# Patient Record
Sex: Female | Born: 1955 | Race: White | Hispanic: No | Marital: Married | Smoking: Never smoker
Health system: Southern US, Community
[De-identification: ages and names within clinical notes are randomized; demographics above are authoritative.]

## PROBLEM LIST (undated history)

## (undated) DIAGNOSIS — G459 Transient cerebral ischemic attack, unspecified: Secondary | ICD-10-CM

## (undated) DIAGNOSIS — I4891 Unspecified atrial fibrillation: Secondary | ICD-10-CM

## (undated) DIAGNOSIS — M1990 Unspecified osteoarthritis, unspecified site: Secondary | ICD-10-CM

## (undated) DIAGNOSIS — Z8619 Personal history of other infectious and parasitic diseases: Secondary | ICD-10-CM

## (undated) DIAGNOSIS — C801 Malignant (primary) neoplasm, unspecified: Secondary | ICD-10-CM

## (undated) DIAGNOSIS — Z974 Presence of external hearing-aid: Secondary | ICD-10-CM

## (undated) HISTORY — DX: Personal history of other infectious and parasitic diseases: Z86.19

## (undated) HISTORY — PX: VAGINAL HYSTERECTOMY: SUR661

---

## 2007-02-18 ENCOUNTER — Ambulatory Visit: Payer: Self-pay | Admitting: Family Medicine

## 2007-02-21 ENCOUNTER — Ambulatory Visit: Payer: Self-pay | Admitting: Otolaryngology

## 2007-03-11 ENCOUNTER — Ambulatory Visit: Payer: Self-pay | Admitting: Gastroenterology

## 2007-05-13 ENCOUNTER — Ambulatory Visit: Payer: Self-pay | Admitting: Family Medicine

## 2007-09-19 ENCOUNTER — Ambulatory Visit: Payer: Self-pay | Admitting: Family Medicine

## 2008-05-13 ENCOUNTER — Ambulatory Visit: Payer: Self-pay | Admitting: Family Medicine

## 2008-08-09 ENCOUNTER — Ambulatory Visit: Payer: Self-pay | Admitting: Family Medicine

## 2009-06-24 ENCOUNTER — Ambulatory Visit: Payer: Self-pay | Admitting: Family Medicine

## 2010-08-31 ENCOUNTER — Ambulatory Visit: Payer: Self-pay | Admitting: Family Medicine

## 2011-03-29 LAB — HM COLONOSCOPY

## 2011-07-15 ENCOUNTER — Ambulatory Visit: Payer: Self-pay | Admitting: Internal Medicine

## 2011-08-04 ENCOUNTER — Ambulatory Visit: Payer: Self-pay | Admitting: Urology

## 2011-10-18 ENCOUNTER — Ambulatory Visit: Payer: Self-pay | Admitting: Family Medicine

## 2012-03-03 ENCOUNTER — Ambulatory Visit: Payer: Self-pay | Admitting: Physician Assistant

## 2012-08-14 HISTORY — PX: COLONOSCOPY: SHX174

## 2012-10-23 LAB — HM PAP SMEAR: HM Pap smear: NORMAL

## 2012-10-24 LAB — LIPID PANEL
CHOLESTEROL: 204 mg/dL — AB (ref 0–200)
HDL: 53 mg/dL (ref 35–70)
LDL Cholesterol: 137 mg/dL
TRIGLYCERIDES: 72 mg/dL (ref 40–160)

## 2012-10-24 LAB — FECAL OCCULT BLOOD, GUAIAC: Fecal Occult Blood: NEGATIVE

## 2012-10-31 ENCOUNTER — Ambulatory Visit: Payer: Self-pay | Admitting: Family Medicine

## 2012-11-05 ENCOUNTER — Ambulatory Visit: Payer: Self-pay | Admitting: Family Medicine

## 2012-11-05 LAB — HM DEXA SCAN

## 2012-11-18 ENCOUNTER — Ambulatory Visit: Payer: Self-pay | Admitting: Family Medicine

## 2012-11-18 LAB — HM MAMMOGRAPHY

## 2012-12-03 ENCOUNTER — Ambulatory Visit: Payer: Self-pay | Admitting: Surgery

## 2012-12-03 HISTORY — PX: BREAST BIOPSY: SHX20

## 2012-12-05 LAB — PATHOLOGY REPORT

## 2014-07-10 ENCOUNTER — Ambulatory Visit: Payer: Self-pay | Admitting: Physician Assistant

## 2014-07-10 LAB — URINALYSIS, COMPLETE
Glucose,UR: NEGATIVE
KETONE: NEGATIVE
Nitrite: NEGATIVE
PH: 6 (ref 5.0–8.0)
Protein: >=300
Specific Gravity: 1.02 (ref 1.000–1.030)

## 2014-07-12 LAB — URINE CULTURE

## 2015-02-17 ENCOUNTER — Encounter: Payer: Self-pay | Admitting: Family Medicine

## 2015-02-17 ENCOUNTER — Telehealth: Payer: Self-pay | Admitting: Surgery

## 2015-02-17 ENCOUNTER — Ambulatory Visit (INDEPENDENT_AMBULATORY_CARE_PROVIDER_SITE_OTHER): Payer: BC Managed Care – PPO | Admitting: Family Medicine

## 2015-02-17 ENCOUNTER — Other Ambulatory Visit: Payer: Self-pay | Admitting: Family Medicine

## 2015-02-17 VITALS — BP 100/62 | HR 72 | Ht 67.0 in | Wt 121.0 lb

## 2015-02-17 DIAGNOSIS — Z8619 Personal history of other infectious and parasitic diseases: Secondary | ICD-10-CM

## 2015-02-17 DIAGNOSIS — M810 Age-related osteoporosis without current pathological fracture: Secondary | ICD-10-CM

## 2015-02-17 DIAGNOSIS — Z0000 Encounter for general adult medical examination without abnormal findings: Secondary | ICD-10-CM | POA: Diagnosis not present

## 2015-02-17 LAB — HEMOCCULT GUIAC POC 1CARD (OFFICE): FECAL OCCULT BLD: NEGATIVE

## 2015-02-17 LAB — POCT URINALYSIS DIPSTICK
BILIRUBIN UA: NEGATIVE
Glucose, UA: NEGATIVE
Ketones, UA: NEGATIVE
Leukocytes, UA: NEGATIVE
Nitrite, UA: NEGATIVE
Protein, UA: NEGATIVE
RBC UA: NEGATIVE
SPEC GRAV UA: 1.01
Urobilinogen, UA: 0.2
pH, UA: 5

## 2015-02-17 MED ORDER — VALACYCLOVIR HCL 500 MG PO TABS: 500.0000 mg | ORAL_TABLET | Freq: Two times a day (BID) | ORAL | Status: DC

## 2015-02-17 NOTE — Telephone Encounter (Signed)
Dr. Ronnald Ramp needs clearance letter per pt.

## 2015-02-17 NOTE — Addendum Note (Signed)
Addended by: Fredderick Severance on: 02/17/2015 12:05 PM   Modules accepted: Orders

## 2015-02-17 NOTE — Telephone Encounter (Signed)
Spoke with patient to better understand what it is that she needs. She states that Dr. Ronnald Ramp was requesting the results of her breast biopsy in 2014 as well as a letter stating that she may return to yearly mammograms. Last office note from 12/13/12 and pathology results faxed over at this time.

## 2015-02-17 NOTE — Progress Notes (Signed)
Name: Margaret Kennedy   MRN: [id number]    DOB: November 06, 1955   Date:02/17/2015       Progress Note  Subjective  Chief Complaint  Chief Complaint  Patient presents with  . Annual Exam    pap- no issues    HPI Comments: Patient with no medical issues or physical complaints.     No problem-specific assessment & plan notes found for this encounter.   Past Medical History  Diagnosis Date  . History of cold sores     Past Surgical History  Procedure Laterality Date  . Vaginal hysterectomy    . Colonoscopy  2014    cleared for 5 years    Family History  Problem Relation Age of Onset  . Cancer Mother   . Cancer Father   . Cancer Daughter     History   Social History  . Marital Status: Married    Spouse Name: N/A  . Number of Children: N/A  . Years of Education: N/A   Occupational History  . Not on file.   Social History Main Topics  . Smoking status: Never Smoker   . Smokeless tobacco: Not on file  . Alcohol Use: 0.0 oz/week    0 Standard drinks or equivalent per week  . Drug Use: No  . Sexual Activity: Yes   Other Topics Concern  . Not on file   Social History Narrative  . No narrative on file    No Known Allergies   Review of Systems  Constitutional: Negative for fever, chills, weight loss and malaise/fatigue.  HENT: Negative for ear discharge, ear pain and sore throat.   Eyes: Negative for blurred vision.  Respiratory: Negative for cough, sputum production, shortness of breath and wheezing.   Cardiovascular: Negative for chest pain, palpitations and leg swelling.  Gastrointestinal: Negative for heartburn, nausea, abdominal pain, diarrhea, constipation, blood in stool and melena.  Genitourinary: Positive for frequency. Negative for dysuria, urgency and hematuria.  Musculoskeletal: Negative for myalgias, back pain, joint pain and neck pain.  Skin: Negative for rash.  Neurological: Negative for dizziness, tingling, sensory change, focal weakness and  headaches.  Endo/Heme/Allergies: Negative for environmental allergies and polydipsia. Does not bruise/bleed easily.  Psychiatric/Behavioral: Negative for depression and suicidal ideas. The patient is not nervous/anxious and does not have insomnia.      Objective  Filed Vitals:   02/17/15 0831  BP: 100/62  Pulse: 72  Height: 5\' 7"  (1.702 m)  Weight: 121 lb (54.885 kg)    Physical Exam  Constitutional: She is well-developed, well-nourished, and in no distress. No distress.  HENT:  Head: Normocephalic and atraumatic.  Right Ear: External ear normal.  Left Ear: External ear normal.  Nose: Nose normal.  Mouth/Throat: Oropharynx is clear and moist.  Eyes: Conjunctivae and EOM are normal. Pupils are equal, round, and reactive to light. Right eye exhibits no discharge. Left eye exhibits no discharge.  Neck: Normal range of motion. Neck supple. No JVD present. No tracheal deviation present. No thyromegaly present.  Cardiovascular: Normal rate, regular rhythm, normal heart sounds and intact distal pulses.  Exam reveals no gallop and no friction rub.   No murmur heard. Pulmonary/Chest: Effort normal and breath sounds normal. No respiratory distress. She has no wheezes. She has no rales. She exhibits no tenderness.  Abdominal: Soft. Bowel sounds are normal. She exhibits no mass. There is no tenderness. There is no guarding.  Genitourinary: Vagina normal, right adnexa normal and left adnexa normal. Guaiac negative stool.  No vaginal discharge found.  Musculoskeletal: Normal range of motion. She exhibits no edema.  Lymphadenopathy:    She has no cervical adenopathy.  Neurological: She is alert. She has normal reflexes.  Skin: Skin is warm and dry. She is not diaphoretic.  Psychiatric: Mood and affect normal.  Nursing note and vitals reviewed.     Assessment & Plan  Problem List Items Addressed This Visit    None    Visit Diagnoses    Annual physical exam    -  Primary    Relevant  Orders    Lipid Profile    Renal Function Panel    Cytology - PAP    POCT Urinalysis Dipstick    POCT Occult Blood Stool    Osteoporosis        Relevant Orders    DG Bone Density    Hx of cold sores        Relevant Medications    valACYclovir (VALTREX) 500 MG tablet         Dr. Deanna Jones [facility]  02/17/2015

## 2015-02-18 LAB — RENAL FUNCTION PANEL
Albumin: 4.6 g/dL (ref 3.5–5.5)
BUN/Creatinine Ratio: 23 (ref 9–23)
BUN: 15 mg/dL (ref 6–24)
CO2: 24 mmol/L (ref 18–29)
Calcium: 9.4 mg/dL (ref 8.7–10.2)
Chloride: 100 mmol/L (ref 97–108)
Creatinine, Ser: 0.64 mg/dL (ref 0.57–1.00)
GFR calc Af Amer: 114 mL/min/{1.73_m2} (ref >59)
GFR calc non Af Amer: 99 mL/min/{1.73_m2} (ref >59)
GLUCOSE: 76 mg/dL (ref 65–99)
Phosphorus: 3.7 mg/dL (ref 2.5–4.5)
Potassium: 4.3 mmol/L (ref 3.5–5.2)
SODIUM: 140 mmol/L (ref 134–144)

## 2015-02-18 LAB — LIPID PANEL
Chol/HDL Ratio: 3.5 ratio units (ref 0.0–4.4)
Cholesterol, Total: 229 mg/dL — ABNORMAL HIGH (ref 100–199)
HDL: 65 mg/dL (ref >39)
LDL Calculated: 149 mg/dL — ABNORMAL HIGH (ref 0–99)
Triglycerides: 77 mg/dL (ref 0–149)
VLDL CHOLESTEROL CAL: 15 mg/dL (ref 5–40)

## 2015-02-18 NOTE — Addendum Note (Signed)
Addended by: Fredderick Severance on: 02/18/2015 04:17 PM   Modules accepted: Orders

## 2015-02-22 LAB — PAP LB (LIQUID-BASED): PAP SMEAR COMMENT: 0

## 2015-02-23 ENCOUNTER — Ambulatory Visit
Admission: RE | Admit: 2015-02-23 | Discharge: 2015-02-23 | Disposition: A | Discharge status: Home / Self Care | Payer: BC Managed Care – PPO | Source: Ambulatory Visit | Attending: Family Medicine | Admitting: Family Medicine

## 2015-02-23 DIAGNOSIS — M810 Age-related osteoporosis without current pathological fracture: Secondary | ICD-10-CM | POA: Diagnosis present

## 2015-02-23 DIAGNOSIS — Z1231 Encounter for screening mammogram for malignant neoplasm of breast: Secondary | ICD-10-CM | POA: Insufficient documentation

## 2015-02-23 DIAGNOSIS — Z0000 Encounter for general adult medical examination without abnormal findings: Secondary | ICD-10-CM

## 2015-02-23 HISTORY — DX: Malignant (primary) neoplasm, unspecified: C80.1

## 2015-05-04 NOTE — Telephone Encounter (Signed)
error 

## 2015-06-10 ENCOUNTER — Ambulatory Visit (INDEPENDENT_AMBULATORY_CARE_PROVIDER_SITE_OTHER): Payer: BC Managed Care – PPO | Admitting: Family Medicine

## 2015-06-10 ENCOUNTER — Encounter: Payer: Self-pay | Admitting: Family Medicine

## 2015-06-10 VITALS — BP 110/78 | HR 70 | Ht 67.0 in | Wt 125.0 lb

## 2015-06-10 DIAGNOSIS — N3090 Cystitis, unspecified without hematuria: Secondary | ICD-10-CM | POA: Diagnosis not present

## 2015-06-10 DIAGNOSIS — B373 Candidiasis of vulva and vagina: Secondary | ICD-10-CM

## 2015-06-10 DIAGNOSIS — B3731 Acute candidiasis of vulva and vagina: Secondary | ICD-10-CM

## 2015-06-10 LAB — POCT URINALYSIS DIPSTICK
Bilirubin, UA: NEGATIVE
Glucose, UA: NEGATIVE
KETONES UA: NEGATIVE
Nitrite, UA: NEGATIVE
PROTEIN UA: NEGATIVE
Spec Grav, UA: 1.01
Urobilinogen, UA: 0.2
pH, UA: 5

## 2015-06-10 MED ORDER — SULFAMETHOXAZOLE-TRIMETHOPRIM 800-160 MG PO TABS: 1.0000 | ORAL_TABLET | Freq: Two times a day (BID) | ORAL | Status: DC

## 2015-06-10 MED ORDER — FLUCONAZOLE 150 MG PO TABS: 150.0000 mg | ORAL_TABLET | Freq: Once | ORAL | Status: DC

## 2015-06-10 NOTE — Progress Notes (Signed)
Name: Margaret Kennedy   MRN: [id number]    DOB: [birth year]   Date:06/10/2015       Progress Note  Subjective  Chief Complaint  Chief Complaint  Patient presents with  . Urinary Tract Infection    Urinary Tract Infection  This is a new problem. The current episode started 1 to 4 weeks ago. The problem occurs every urination. The problem has been waxing and waning. The quality of the pain is described as burning. The pain is at a severity of 2/10. The pain is mild. There has been no fever. Pertinent negatives include no chills, discharge, flank pain, frequency, hematuria, hesitancy, nausea or urgency. 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 The treatment provided no relief.    No problem-specific assessment & plan notes found for this encounter.   Past Medical History  Diagnosis Date  . History of cold sores   . Cancer [facility])     skin    Past Surgical History  Procedure Laterality Date  . Vaginal hysterectomy    . Colonoscopy  2014    cleared for 5 years  . Breast biopsy Left 12/03/12    u/s bx/clip-neg    Family History  Problem Relation Age of Onset  . Cancer Mother   . Cancer Father   . Cancer Daughter   . Breast cancer Daughter 58    Social History   Social History  . Marital Status: Married    Spouse Name: N/A  . Number of Children: N/A  . Years of Education: N/A   Occupational History  . Not on file.   Social History Main Topics  . Smoking status: Never Smoker   . Smokeless tobacco: Not on file  . Alcohol Use: 0.0 oz/week    0 Standard drinks or equivalent per week  . Drug Use: No  . Sexual Activity: Yes   Other Topics Concern  . Not on file   Social History Narrative    No Known Allergies   Review of Systems  Constitutional: Negative for fever, chills, weight loss and malaise/fatigue.  HENT: Negative for ear discharge, ear pain and sore throat.   Eyes: Negative for blurred vision.  Respiratory: Negative for cough, sputum  production, shortness of breath and wheezing.   Cardiovascular: Negative for chest pain, palpitations and leg swelling.  Gastrointestinal: Negative for heartburn, nausea, abdominal pain, diarrhea, constipation, blood in stool and melena.  Genitourinary: Negative for dysuria, hesitancy, urgency, frequency, hematuria and flank pain.  Musculoskeletal: Negative for myalgias, back pain, joint pain and neck pain.  Skin: Negative for rash.  Neurological: Negative for dizziness, tingling, sensory change, focal weakness and headaches.  Endo/Heme/Allergies: Negative for environmental allergies and polydipsia. Does not bruise/bleed easily.  Psychiatric/Behavioral: Negative for depression and suicidal ideas. The patient is not nervous/anxious and does not have insomnia.      Objective  Filed Vitals:   06/10/15 1537  BP: 110/78  Pulse: 70  Height: 5\' 7"  (1.702 m)  Weight: 125 lb (56.7 kg)    Physical Exam  Constitutional: She is well-developed, well-nourished, and in no distress. No distress.  HENT:  Head: Normocephalic and atraumatic.  Right Ear: External ear normal.  Left Ear: External ear normal.  Nose: Nose normal.  Mouth/Throat: Oropharynx is clear and moist.  Eyes: Conjunctivae and EOM are normal. Pupils are equal, round, and reactive to light. Right eye exhibits no discharge. Left eye exhibits no discharge.  Neck: Normal range of motion. Neck supple. No JVD present.  No thyromegaly present.  Cardiovascular: Normal rate, regular rhythm, normal heart sounds and intact distal pulses.  Exam reveals no gallop and no friction rub.   No murmur heard. Pulmonary/Chest: Effort normal and breath sounds normal.  Abdominal: Soft. Bowel sounds are normal. She exhibits no mass. There is no tenderness. There is no guarding.  Musculoskeletal: Normal range of motion. She exhibits no edema.  Lymphadenopathy:    She has no cervical adenopathy.  Neurological: She is alert.  Skin: Skin is warm and dry.  She is not diaphoretic.  Psychiatric: Mood and affect normal.  Nursing note and vitals reviewed.     Assessment & Plan  Problem List Items Addressed This Visit    None    Visit Diagnoses    Cystitis    -  Primary    Relevant Medications    sulfamethoxazole-trimethoprim (BACTRIM DS,SEPTRA DS) 800-160 MG tablet    Other Relevant Orders    POCT Urinalysis Dipstick (Completed)    Yeast vaginitis        Relevant Medications    sulfamethoxazole-trimethoprim (BACTRIM DS,SEPTRA DS) 800-160 MG tablet    fluconazole (DIFLUCAN) 150 MG tablet         Dr. Macon [facility]  06/10/2015

## 2015-07-11 ENCOUNTER — Emergency Department
Admission: EM | Admit: 2015-07-11 | Discharge: 2015-07-11 | Disposition: A | Discharge status: Home / Self Care | Payer: BC Managed Care – PPO | Attending: Emergency Medicine | Admitting: Emergency Medicine

## 2015-07-11 ENCOUNTER — Emergency Department: Payer: BC Managed Care – PPO

## 2015-07-11 DIAGNOSIS — Y9289 Other specified places as the place of occurrence of the external cause: Secondary | ICD-10-CM | POA: Insufficient documentation

## 2015-07-11 DIAGNOSIS — Y998 Other external cause status: Secondary | ICD-10-CM | POA: Insufficient documentation

## 2015-07-11 DIAGNOSIS — Y9301 Activity, walking, marching and hiking: Secondary | ICD-10-CM | POA: Diagnosis not present

## 2015-07-11 DIAGNOSIS — Z79899 Other long term (current) drug therapy: Secondary | ICD-10-CM | POA: Insufficient documentation

## 2015-07-11 DIAGNOSIS — Z23 Encounter for immunization: Secondary | ICD-10-CM | POA: Insufficient documentation

## 2015-07-11 DIAGNOSIS — W108XXA Fall (on) (from) other stairs and steps, initial encounter: Secondary | ICD-10-CM | POA: Diagnosis not present

## 2015-07-11 DIAGNOSIS — S60511A Abrasion of right hand, initial encounter: Secondary | ICD-10-CM | POA: Insufficient documentation

## 2015-07-11 DIAGNOSIS — Z792 Long term (current) use of antibiotics: Secondary | ICD-10-CM | POA: Insufficient documentation

## 2015-07-11 DIAGNOSIS — S8992XA Unspecified injury of left lower leg, initial encounter: Secondary | ICD-10-CM | POA: Diagnosis present

## 2015-07-11 DIAGNOSIS — M2392 Unspecified internal derangement of left knee: Secondary | ICD-10-CM | POA: Insufficient documentation

## 2015-07-11 MED ORDER — DOCUSATE SODIUM 100 MG PO CAPS: ORAL_CAPSULE | ORAL | Status: DC

## 2015-07-11 MED ORDER — TETANUS-DIPHTH-ACELL PERTUSSIS 5-2.5-18.5 LF-MCG/0.5 IM SUSP
INTRAMUSCULAR | Status: AC
  Filled 2015-07-11: qty 0.5

## 2015-07-11 MED ORDER — TETANUS-DIPHTH-ACELL PERTUSSIS 5-2.5-18.5 LF-MCG/0.5 IM SUSP
0.5000 mL | Freq: Once | INTRAMUSCULAR | Status: AC
  Administered 2015-07-11: 0.5 mL via INTRAMUSCULAR

## 2015-07-11 MED ORDER — TETANUS-DIPHTHERIA TOXOIDS TD 5-2 LFU IM INJ
INJECTION | INTRAMUSCULAR | Status: AC
  Filled 2015-07-11: qty 0.5

## 2015-07-11 MED ORDER — HYDROCODONE-ACETAMINOPHEN 5-325 MG PO TABS: 1.0000 | ORAL_TABLET | ORAL | Status: DC | PRN

## 2015-07-11 NOTE — ED Notes (Signed)
Pt brought to registration desk via wheelchair. Pt reports she missed a step and fell twisting her left knee. Pt also has an abrasion on her right palm. Pt denies other injuries.

## 2015-07-11 NOTE — Discharge Instructions (Signed)
As we discussed, though you do not have any bony injuries to your knee, you likely have some soft tissue / tendon injuries.  Please use the knee immobilizer as much as possible (with your crutches) and follow up this coming week with Dr. Roland Rack.  Use over-the-counter pain medicine as needed.  Read through the included information about routine care for injuries (rest, ice, compression, and elevation).  Take Norco as prescribed for severe pain. Do not drink alcohol, drive or participate in any other potentially dangerous activities while taking this medication as it may make you sleepy. Do not take this medication with any other sedating medications, either prescription or over-the-counter. If you were prescribed Percocet or Vicodin, do not take these with acetaminophen (Tylenol) as it is already contained within these medications.   This medication is an opiate (or narcotic) pain medication and can be habit forming.  Use it as little as possible to achieve adequate pain control.  Do not use or use it with extreme caution if you have a history of opiate abuse or dependence.  If you are on a pain contract with your primary care doctor or a pain specialist, be sure to let them know you were prescribed this medication today from the [facility] Emergency Department.  This medication is intended for your use only - do not give any to anyone else and keep it in a secure place where nobody else, especially children, have access to it.  It will also cause or worsen constipation, so you may want to consider taking an over-the-counter stool softener while you are taking this medication.

## 2015-07-11 NOTE — ED Provider Notes (Signed)
[facility] Emergency Department Provider Note  ____________________________________________  Time seen: Approximately 8:47 PM  I have reviewed the triage vital signs and the nursing notes.   HISTORY  Chief Complaint Knee Pain    HPI Margaret Kennedy is a 59 y.o. female with no significant past medical history and who does not take any chronic anticoagulation presents with pain and swelling to her left knee.  This occurred acutely when she missed a step walking down stairs and fell on and twisted her left knee.  She has a small abrasion on her right palm.  She did not sustain any other injuries, did not strike her head, did not lose consciousness, and has no neck pain.  She describes the pain as severe in her left knee with any movement of the knee and mild to moderate at rest.  Movement makes it worse and ice packs and rest make it slightly better.  She is not able to bear weight on the knee.   Past Medical History  Diagnosis Date  . History of cold sores   . Cancer ([place])     skin    There are no active problems to display for this patient.   Past Surgical History  Procedure Laterality Date  . Vaginal hysterectomy    . Colonoscopy  2014    cleared for 5 years  . Breast biopsy Left 12/03/12    u/s bx/clip-neg    Current Outpatient Rx  Name  Route  Sig  Dispense  Refill  . docusate sodium (COLACE) 100 MG capsule      Take 1 tablet once or twice daily as needed for constipation while taking narcotic pain medicine   30 capsule   0   . fluconazole (DIFLUCAN) 150 MG tablet   Oral   Take 1 tablet (150 mg total) by mouth once.   2 tablet   0   . HYDROcodone-acetaminophen (NORCO/VICODIN) 5-325 MG tablet   Oral   Take 1-2 tablets by mouth every 4 (four) hours as needed for moderate pain.   15 tablet   0   . sulfamethoxazole-trimethoprim (BACTRIM DS,SEPTRA DS) 800-160 MG tablet   Oral   Take 1 tablet by mouth 2 (two) times daily.   12 tablet    0   . valACYclovir (VALTREX) 500 MG tablet   Oral   Take 1 tablet (500 mg total) by mouth 2 (two) times daily. As needed   30 tablet   0     Allergies Review of patient's allergies indicates no known allergies.  Family History  Problem Relation Age of Onset  . Cancer Mother   . Cancer Father   . Cancer Daughter   . Breast cancer Daughter 81    Social History Social History  Substance Use Topics  . Smoking status: Never Smoker   . Smokeless tobacco: Not on file  . Alcohol Use: 0.0 oz/week    0 Standard drinks or equivalent per week    Review of Systems Constitutional: No fever/chills Eyes: No visual changes. ENT: No sore throat. Cardiovascular: Denies chest pain. Respiratory: Denies shortness of breath. Gastrointestinal: No abdominal pain.  No nausea, no vomiting.  No diarrhea.  No constipation. Genitourinary: Negative for dysuria. Musculoskeletal: Moderate to severe pain and swelling in the left knee after mechanical fall Skin: Negative for rash. Neurological: Negative for headaches, focal weakness or numbness.  10-point ROS otherwise negative.  ____________________________________________   PHYSICAL EXAM:  VITAL SIGNS: ED Triage Vitals  Enc Vitals Group     BP 07/11/15 1939 135/60 mmHg     Pulse Rate 07/11/15 1939 64     Resp 07/11/15 1939 18     Temp 07/11/15 1939 97.8 F (36.6 C)     Temp Source 07/11/15 1939 Oral     SpO2 07/11/15 1939 97 %     Weight 07/11/15 1939 125 lb (56.7 kg)     Height 07/11/15 1939 5\' 7"  (1.702 m)     Head Cir --      Peak Flow --      Pain Score 07/11/15 1937 5     Pain Loc --      Pain Edu? --      Excl. in [place]? --     Constitutional: Alert and oriented. Well appearing and in no acute distress. Eyes: Conjunctivae are normal. PERRL. EOMI. Head: Atraumatic. Nose: No congestion/rhinnorhea. Mouth/Throat: Mucous membranes are moist.  Oropharynx non-erythematous. Neck: No stridor.  No cervical spine  tenderness. Cardiovascular: Normal rate, regular rhythm. Grossly normal heart sounds.  Good peripheral circulation. Respiratory: Normal respiratory effort.  No retractions. Lungs CTAB. Gastrointestinal: Soft and nontender. No distention. No abdominal bruits. No CVA tenderness. Musculoskeletal: Moderate swelling/effusions all around the left knee with ecchymosis most notable below the patella.  Tenderness with Lachman test but no gross laxity appreciated.  Tenderness with flexion and extension.  She is able to perform a straight leg raise and keep the leg extended and elevated under her own power. Neurologic:  Normal speech and language. No gross focal neurologic deficits are appreciated.  Skin:  Skin is warm, dry and intact. No rash noted. Psychiatric: Mood and affect are normal. Speech and behavior are normal.  ____________________________________________   LABS (all labs ordered are listed, but only abnormal results are displayed)  Labs Reviewed - No data to display ____________________________________________  EKG  Not indicated ____________________________________________  RADIOLOGY   Dg Knee Complete 4 Views Left  07/11/2015  CLINICAL DATA:  Slipped on steps and fell, left knee pain and swelling EXAM: LEFT KNEE - COMPLETE 4+ VIEW COMPARISON:  None. FINDINGS: No fracture or dislocation.  Large knee joint effusion however. IMPRESSION: Large joint effusion.  Otherwise negative. Electronically Signed   By: Skipper Cliche M.D.   On: 07/11/2015 20:22    ____________________________________________   PROCEDURES  Procedure(s) performed: None  Critical Care performed: No ____________________________________________   INITIAL IMPRESSION / ASSESSMENT AND PLAN / ED COURSE  Pertinent labs & imaging results that were available during my care of the patient were reviewed by me and considered in my medical decision making (see chart for details).  I discussed the case by phone  with Dr. Roland Rack who reviewed the x-rays personally.  He recommended a knee immobilizer, weightbearing as tolerated, crutches, routine care for injuries, and outpatient follow-up.  I discussed this with the patient and her husband and she understands and she agrees with the plan.  ____________________________________________  FINAL CLINICAL IMPRESSION(S) / ED DIAGNOSES  Final diagnoses:  Internal derangement of knee joint, left      NEW MEDICATIONS STARTED DURING THIS VISIT:  New Prescriptions   DOCUSATE SODIUM (COLACE) 100 MG CAPSULE    Take 1 tablet once or twice daily as needed for constipation while taking narcotic pain medicine   HYDROCODONE-ACETAMINOPHEN (NORCO/VICODIN) 5-325 MG TABLET    Take 1-2 tablets by mouth every 4 (four) hours as needed for moderate pain.     Hinda Kehr, MD 07/11/15 2128

## 2015-07-11 NOTE — ED Notes (Signed)
Patient transported to X-ray 

## 2015-10-07 ENCOUNTER — Other Ambulatory Visit: Payer: Self-pay | Admitting: Otolaryngology

## 2015-10-07 DIAGNOSIS — H905 Unspecified sensorineural hearing loss: Secondary | ICD-10-CM

## 2015-11-02 ENCOUNTER — Ambulatory Visit
Admission: RE | Admit: 2015-11-02 | Discharge: 2015-11-02 | Disposition: A | Discharge status: Home / Self Care | Payer: BC Managed Care – PPO | Source: Ambulatory Visit | Attending: Otolaryngology | Admitting: Otolaryngology

## 2015-11-02 DIAGNOSIS — H905 Unspecified sensorineural hearing loss: Secondary | ICD-10-CM

## 2015-11-02 MED ORDER — GADOBENATE DIMEGLUMINE 529 MG/ML IV SOLN
15.0000 mL | Freq: Once | INTRAVENOUS | Status: AC | PRN
  Administered 2015-11-02: 11 mL via INTRAVENOUS

## 2016-03-09 ENCOUNTER — Encounter: Payer: Self-pay | Admitting: Family Medicine

## 2016-03-09 ENCOUNTER — Ambulatory Visit (INDEPENDENT_AMBULATORY_CARE_PROVIDER_SITE_OTHER): Payer: BC Managed Care – PPO | Admitting: Family Medicine

## 2016-03-09 VITALS — BP 100/70 | HR 60 | Ht 67.0 in | Wt 121.0 lb

## 2016-03-09 DIAGNOSIS — Z1239 Encounter for other screening for malignant neoplasm of breast: Secondary | ICD-10-CM

## 2016-03-09 DIAGNOSIS — Z8619 Personal history of other infectious and parasitic diseases: Secondary | ICD-10-CM

## 2016-03-09 DIAGNOSIS — Z79899 Other long term (current) drug therapy: Secondary | ICD-10-CM

## 2016-03-09 DIAGNOSIS — Z87440 Personal history of urinary (tract) infections: Secondary | ICD-10-CM | POA: Diagnosis not present

## 2016-03-09 DIAGNOSIS — Z0000 Encounter for general adult medical examination without abnormal findings: Secondary | ICD-10-CM

## 2016-03-09 DIAGNOSIS — B009 Herpesviral infection, unspecified: Secondary | ICD-10-CM | POA: Diagnosis not present

## 2016-03-09 MED ORDER — VALACYCLOVIR HCL 500 MG PO TABS: 500.0000 mg | ORAL_TABLET | Freq: Two times a day (BID) | ORAL | 2 refills | Status: DC

## 2016-03-09 MED ORDER — CIPROFLOXACIN HCL 250 MG PO TABS: 250.0000 mg | ORAL_TABLET | Freq: Two times a day (BID) | ORAL | 0 refills | Status: DC

## 2016-03-09 NOTE — Progress Notes (Signed)
Name: Margaret Kennedy   MRN: [id number]    DOB: 1955/11/15   Date:03/09/2016       Progress Note  Subjective  Chief Complaint  Chief Complaint  Patient presents with  . Annual Exam    no pap  . Medication Refill    Acyclovir    Patient presents for physical exam.   Medication Refill  Associated symptoms include a rash. Pertinent negatives include no abdominal pain, chest pain, chills, congestion, coughing, fever, myalgias, nausea, neck pain or sore throat.    No problem-specific Assessment & Plan notes found for this encounter.   Past Medical History:  Diagnosis Date  . Cancer ([place])    skin  . History of cold sores     Past Surgical History:  Procedure Laterality Date  . BREAST BIOPSY Left 12/03/12   u/s bx/clip-neg  . COLONOSCOPY  2014   cleared for 5 years  . VAGINAL HYSTERECTOMY      Family History  Problem Relation Age of Onset  . Cancer Mother   . Cancer Father   . Cancer Daughter   . Breast cancer Daughter 19    Social History   Social History  . Marital status: Married    Spouse name: N/A  . Number of children: N/A  . Years of education: N/A   Occupational History  . Not on file.   Social History Main Topics  . Smoking status: Never Smoker  . Smokeless tobacco: Not on file  . Alcohol use 0.0 oz/week  . Drug use: No  . Sexual activity: Yes   Other Topics Concern  . Not on file   Social History Narrative  . No narrative on file    No Known Allergies   Review of Systems  Constitutional: Negative for chills, fever, malaise/fatigue and weight loss.  HENT: Negative for congestion, ear discharge, ear pain, hearing loss, nosebleeds, sore throat and tinnitus.   Eyes: Negative for blurred vision, double vision, photophobia, pain and redness.  Respiratory: Negative for cough, hemoptysis, sputum production, shortness of breath, wheezing and stridor.   Cardiovascular: Negative for chest pain, palpitations, orthopnea, claudication, leg swelling  and PND.  Gastrointestinal: Negative for abdominal pain, blood in stool, constipation, diarrhea, heartburn, melena and nausea.  Genitourinary: Negative for dysuria, flank pain, frequency, hematuria and urgency.  Musculoskeletal: Negative for back pain, joint pain, myalgias and neck pain.  Skin: Positive for rash. Negative for itching.       occ cold sore  Neurological: Negative for dizziness, tingling, sensory change and focal weakness.  Endo/Heme/Allergies: Negative for environmental allergies and polydipsia. Does not bruise/bleed easily.  Psychiatric/Behavioral: Negative for depression and suicidal ideas. The patient is not nervous/anxious and does not have insomnia.      Objective  Vitals:   03/09/16 0856  BP: 100/70  Pulse: 60  Weight: 121 lb (54.9 kg)  Height: 5\' 7"  (1.702 m)    Physical Exam  Constitutional: She is well-developed, well-nourished, and in no distress. No distress.  HENT:  Head: Normocephalic and atraumatic.  Right Ear: External ear normal.  Left Ear: External ear normal.  Nose: Nose normal.  Mouth/Throat: Oropharynx is clear and moist.  Eyes: Conjunctivae and EOM are normal. Pupils are equal, round, and reactive to light. Right eye exhibits no discharge. Left eye exhibits no discharge.  Neck: Normal range of motion. Neck supple. No JVD present. No thyromegaly present.  Cardiovascular: Normal rate, regular rhythm, normal heart sounds and intact distal pulses.  Exam reveals no  gallop and no friction rub.   No murmur heard. Pulmonary/Chest: Effort normal and breath sounds normal. She has no wheezes. She has no rales. She exhibits no tenderness. Right breast exhibits no inverted nipple, no mass, no nipple discharge, no skin change and no tenderness. Left breast exhibits no inverted nipple, no mass, no nipple discharge, no skin change and no tenderness. Breasts are symmetrical.  Abdominal: Soft. Bowel sounds are normal. She exhibits no mass. There is no tenderness.  There is no guarding.  Musculoskeletal: Normal range of motion. She exhibits no edema.  Lymphadenopathy:    She has no cervical adenopathy.  Neurological: She is alert. She has normal reflexes.  Skin: Skin is warm and dry. No rash noted. She is not diaphoretic. No erythema. No pallor.  Psychiatric: Mood and affect normal.  Nursing note and vitals reviewed.     Assessment & Plan  Problem List Items Addressed This Visit    None    Visit Diagnoses    Annual physical exam    -  Primary   HSV-1 (herpes simplex virus 1) infection       Relevant Medications   valACYclovir (VALTREX) 500 MG tablet   Hx of cold sores       Relevant Medications   valACYclovir (VALTREX) 500 MG tablet   History of recurrent UTIs       Relevant Medications   ciprofloxacin (CIPRO) 250 MG tablet   Breast cancer screening       Relevant Orders   MM Digital Screening   Encounter for long-term (current) use of medications       Relevant Orders   Lipid Profile   Glucose        Dr. Albaraa Swingle [facility]  03/09/16

## 2016-03-10 LAB — LIPID PANEL
CHOL/HDL RATIO: 3.4 ratio (ref 0.0–4.4)
CHOLESTEROL TOTAL: 205 mg/dL — AB (ref 100–199)
HDL: 61 mg/dL (ref >39)
LDL CALC: 130 mg/dL — AB (ref 0–99)
Triglycerides: 68 mg/dL (ref 0–149)
VLDL Cholesterol Cal: 14 mg/dL (ref 5–40)

## 2016-03-10 LAB — GLUCOSE, RANDOM: GLUCOSE: 89 mg/dL (ref 65–99)

## 2016-03-15 ENCOUNTER — Ambulatory Visit
Admission: RE | Admit: 2016-03-15 | Discharge: 2016-03-15 | Disposition: A | Discharge status: Home / Self Care | Payer: BC Managed Care – PPO | Source: Ambulatory Visit | Attending: Family Medicine | Admitting: Family Medicine

## 2016-03-15 ENCOUNTER — Other Ambulatory Visit: Payer: Self-pay | Admitting: Family Medicine

## 2016-03-15 DIAGNOSIS — Z1231 Encounter for screening mammogram for malignant neoplasm of breast: Secondary | ICD-10-CM | POA: Insufficient documentation

## 2016-03-15 DIAGNOSIS — Z1239 Encounter for other screening for malignant neoplasm of breast: Secondary | ICD-10-CM

## 2016-03-24 ENCOUNTER — Encounter: Payer: Self-pay | Admitting: Family Medicine

## 2016-03-24 ENCOUNTER — Ambulatory Visit (INDEPENDENT_AMBULATORY_CARE_PROVIDER_SITE_OTHER): Payer: BC Managed Care – PPO | Admitting: Family Medicine

## 2016-03-24 VITALS — BP 120/70 | HR 78 | Ht 67.0 in | Wt 120.0 lb

## 2016-03-24 DIAGNOSIS — N3090 Cystitis, unspecified without hematuria: Secondary | ICD-10-CM

## 2016-03-24 LAB — POCT URINALYSIS DIPSTICK
BILIRUBIN UA: NEGATIVE
GLUCOSE UA: NEGATIVE
Ketones, UA: NEGATIVE
NITRITE UA: NEGATIVE
Spec Grav, UA: 1.01
UROBILINOGEN UA: 0.2
pH, UA: 5

## 2016-03-24 MED ORDER — SULFAMETHOXAZOLE-TRIMETHOPRIM 800-160 MG PO TABS: 1.0000 | ORAL_TABLET | Freq: Two times a day (BID) | ORAL | 0 refills | Status: DC

## 2016-03-24 NOTE — Progress Notes (Signed)
Name: Margaret Kennedy   MRN: [id number]    DOB: Jul 25, 1956   Date:03/24/2016       Progress Note  Subjective  Chief Complaint  Chief Complaint  Patient presents with  . Urinary Tract Infection    Urinary Tract Infection   This is a new problem. The current episode started in the past 7 days. The problem occurs intermittently. The problem has been waxing and waning. The quality of the pain is described as burning. The pain is at a severity of 4/10. The pain is moderate. There has been no fever. There is no history of pyelonephritis. Associated symptoms include frequency and hematuria. Pertinent negatives include no chills, discharge, flank pain, hesitancy, nausea, sweats, urgency or vomiting. She has tried nothing for the symptoms. The treatment provided mild relief.    No problem-specific Assessment & Plan notes found for this encounter.   Past Medical History:  Diagnosis Date  . Cancer ([place])    skin  . History of cold sores     Past Surgical History:  Procedure Laterality Date  . BREAST BIOPSY Left 12/03/12   u/s bx/clip-neg  . COLONOSCOPY  2014   cleared for 5 years  . VAGINAL HYSTERECTOMY      Family History  Problem Relation Age of Onset  . Cancer Mother   . Cancer Father   . Cancer Daughter   . Breast cancer Daughter 30    Social History   Social History  . Marital status: Married    Spouse name: N/A  . Number of children: N/A  . Years of education: N/A   Occupational History  . Not on file.   Social History Main Topics  . Smoking status: Never Smoker  . Smokeless tobacco: Not on file  . Alcohol use 0.0 oz/week  . Drug use: No  . Sexual activity: Yes   Other Topics Concern  . Not on file   Social History Narrative  . No narrative on file    No Known Allergies   Review of Systems  Constitutional: Negative for chills, fever, malaise/fatigue and weight loss.  HENT: Negative for ear discharge, ear pain and sore throat.   Eyes: Negative for  blurred vision.  Respiratory: Negative for cough, sputum production, shortness of breath and wheezing.   Cardiovascular: Negative for chest pain, palpitations and leg swelling.  Gastrointestinal: Negative for abdominal pain, blood in stool, constipation, diarrhea, heartburn, melena, nausea and vomiting.  Genitourinary: Positive for frequency and hematuria. Negative for dysuria, flank pain, hesitancy and urgency.  Musculoskeletal: Negative for back pain, joint pain, myalgias and neck pain.  Skin: Negative for rash.  Neurological: Negative for dizziness, tingling, sensory change, focal weakness and headaches.  Endo/Heme/Allergies: Negative for environmental allergies and polydipsia. Does not bruise/bleed easily.  Psychiatric/Behavioral: Negative for depression and suicidal ideas. The patient is not nervous/anxious and does not have insomnia.      Objective  Vitals:   03/24/16 1104  BP: 120/70  Pulse: 78  Weight: 120 lb (54.4 kg)  Height: 5\' 7"  (1.702 m)    Physical Exam  Constitutional: She is well-developed, well-nourished, and in no distress. No distress.  HENT:  Head: Normocephalic and atraumatic.  Right Ear: External ear normal.  Left Ear: External ear normal.  Nose: Nose normal.  Mouth/Throat: Oropharynx is clear and moist.  Eyes: Conjunctivae and EOM are normal. Pupils are equal, round, and reactive to light. Right eye exhibits no discharge. Left eye exhibits no discharge.  Neck: Normal range of  motion. Neck supple. No JVD present. No thyromegaly present.  Cardiovascular: Normal rate, regular rhythm, normal heart sounds and intact distal pulses.  Exam reveals no gallop and no friction rub.   No murmur heard. Pulmonary/Chest: Effort normal and breath sounds normal. She has no wheezes. She has no rales.  Abdominal: Soft. Bowel sounds are normal. She exhibits no mass. There is no tenderness. There is no guarding.  Musculoskeletal: Normal range of motion. She exhibits no edema.   Lymphadenopathy:    She has no cervical adenopathy.  Neurological: She is alert. She has normal reflexes.  Skin: Skin is warm and dry. She is not diaphoretic.  Psychiatric: Mood and affect normal.  Nursing note and vitals reviewed.     Assessment & Plan  Problem List Items Addressed This Visit    None    Visit Diagnoses    Cystitis    -  Primary   Relevant Medications   sulfamethoxazole-trimethoprim (BACTRIM DS,SEPTRA DS) 800-160 MG tablet   Other Relevant Orders   POCT Urinalysis Dipstick (Completed)        Dr. [facility]  03/24/16

## 2017-03-19 ENCOUNTER — Encounter: Payer: Self-pay | Admitting: Family Medicine

## 2017-03-19 ENCOUNTER — Ambulatory Visit (INDEPENDENT_AMBULATORY_CARE_PROVIDER_SITE_OTHER): Payer: BC Managed Care – PPO | Admitting: Family Medicine

## 2017-03-19 VITALS — BP 120/70 | HR 60 | Ht 67.0 in | Wt 116.0 lb

## 2017-03-19 DIAGNOSIS — Z1239 Encounter for other screening for malignant neoplasm of breast: Secondary | ICD-10-CM

## 2017-03-19 DIAGNOSIS — Z0000 Encounter for general adult medical examination without abnormal findings: Secondary | ICD-10-CM | POA: Diagnosis not present

## 2017-03-19 DIAGNOSIS — Z1231 Encounter for screening mammogram for malignant neoplasm of breast: Secondary | ICD-10-CM | POA: Diagnosis not present

## 2017-03-19 DIAGNOSIS — Z1159 Encounter for screening for other viral diseases: Secondary | ICD-10-CM | POA: Diagnosis not present

## 2017-03-19 DIAGNOSIS — R634 Abnormal weight loss: Secondary | ICD-10-CM

## 2017-03-19 DIAGNOSIS — N3001 Acute cystitis with hematuria: Secondary | ICD-10-CM

## 2017-03-19 DIAGNOSIS — Z114 Encounter for screening for human immunodeficiency virus [HIV]: Secondary | ICD-10-CM

## 2017-03-19 LAB — POCT URINALYSIS DIPSTICK
Bilirubin, UA: NEGATIVE
GLUCOSE UA: NEGATIVE
Ketones, UA: NEGATIVE
NITRITE UA: NEGATIVE
Protein, UA: NEGATIVE
Spec Grav, UA: 1.01 (ref 1.010–1.025)
Urobilinogen, UA: 0.2 E.U./dL
pH, UA: 6 (ref 5.0–8.0)

## 2017-03-19 MED ORDER — SULFAMETHOXAZOLE-TRIMETHOPRIM 800-160 MG PO TABS: 1.0000 | ORAL_TABLET | Freq: Two times a day (BID) | ORAL | 0 refills | Status: DC

## 2017-03-19 NOTE — Progress Notes (Signed)
Name: Margaret Kennedy   MRN: [id number]    DOB: Dec 26, 1955   Date:03/19/2017       Progress Note  Subjective  Chief Complaint  Chief Complaint  Patient presents with  . Annual Exam    no pap/ mammo due    Patient presents for annual physical exam.    No problem-specific Assessment & Plan notes found for this encounter.   Past Medical History:  Diagnosis Date  . Cancer ([place])    skin  . History of cold sores     Past Surgical History:  Procedure Laterality Date  . BREAST BIOPSY Left 12/03/12   u/s bx/clip-neg  . COLONOSCOPY  2014   cleared for 5 years  . VAGINAL HYSTERECTOMY      Family History  Problem Relation Age of Onset  . Cancer Mother   . Cancer Father   . Cancer Daughter   . Breast cancer Daughter 59    Social History   Social History  . Marital status: Married    Spouse name: N/A  . Number of children: N/A  . Years of education: N/A   Occupational History  . Not on file.   Social History Main Topics  . Smoking status: Never Smoker  . Smokeless tobacco: Never Used  . Alcohol use 0.0 oz/week  . Drug use: No  . Sexual activity: Yes   Other Topics Concern  . Not on file   Social History Narrative  . No narrative on file    No Known Allergies  Outpatient Medications Prior to Visit  Medication Sig Dispense Refill  . docusate sodium (COLACE) 100 MG capsule Take 1 tablet once or twice daily as needed for constipation while taking narcotic pain medicine 30 capsule 0  . valACYclovir (VALTREX) 500 MG tablet Take 1 tablet (500 mg total) by mouth 2 (two) times daily. As needed 30 tablet 2  . sulfamethoxazole-trimethoprim (BACTRIM DS,SEPTRA DS) 800-160 MG tablet Take 1 tablet by mouth 2 (two) times daily. 12 tablet 0   No facility-administered medications prior to visit.     Review of Systems  Constitutional: Positive for weight loss. Negative for chills, diaphoresis, fever and malaise/fatigue.  HENT: Negative for congestion, ear discharge, ear  pain, hearing loss, nosebleeds, sore throat and tinnitus.   Eyes: Negative for blurred vision, double vision, photophobia, pain, discharge and redness.  Respiratory: Negative for cough, hemoptysis, sputum production, shortness of breath and wheezing.   Cardiovascular: Negative for chest pain, palpitations, orthopnea, claudication, leg swelling and PND.  Gastrointestinal: Negative for abdominal pain, blood in stool, constipation, diarrhea, heartburn, melena, nausea and vomiting.  Genitourinary: Negative for dysuria, flank pain, frequency, hematuria and urgency.  Musculoskeletal: Negative for back pain, falls, joint pain, myalgias and neck pain.  Skin: Negative for itching and rash.  Neurological: Negative for dizziness, tingling, tremors, sensory change, speech change, focal weakness, seizures, weakness and headaches.  Endo/Heme/Allergies: Negative for environmental allergies and polydipsia. Does not bruise/bleed easily.  Psychiatric/Behavioral: Negative for depression, hallucinations, memory loss, substance abuse and suicidal ideas. The patient is not nervous/anxious and does not have insomnia.      Objective  Vitals:   03/19/17 0831  BP: 120/70  Pulse: 60  Weight: 116 lb (52.6 kg)  Height: 5\' 7"  (1.702 m)    Physical Exam  Constitutional: She is oriented to person, place, and time and well-developed, well-nourished, and in no distress. No distress.  HENT:  Head: Normocephalic and atraumatic.  Right Ear: Tympanic membrane, external ear and  ear canal normal.  Left Ear: Tympanic membrane, external ear and ear canal normal.  Nose: Nose normal. No mucosal edema.  Mouth/Throat: Uvula is midline, oropharynx is clear and moist and mucous membranes are normal. No oropharyngeal exudate, posterior oropharyngeal edema or posterior oropharyngeal erythema.  Eyes: Pupils are equal, round, and reactive to light. Conjunctivae, EOM and lids are normal. Right eye exhibits no discharge. Left eye  exhibits no discharge.  Fundoscopic exam:      The right eye shows no arteriolar narrowing and no exudate.       The left eye shows no arteriolar narrowing and no exudate.  Neck: Trachea normal and normal range of motion. Neck supple. Normal carotid pulses, no hepatojugular reflux and no JVD present. No tracheal tenderness and no spinous process tenderness present. Carotid bruit is not present. No neck rigidity. No tracheal deviation present. No thyroid mass and no thyromegaly present.  Cardiovascular: Normal rate, regular rhythm, S1 normal, S2 normal, normal heart sounds, intact distal pulses and normal pulses.  PMI is not displaced.  Exam reveals no gallop, no S3, no S4 and no friction rub.   No murmur heard. Pulmonary/Chest: Effort normal and breath sounds normal. No accessory muscle usage or stridor. No respiratory distress. She has no decreased breath sounds. She has no wheezes. She has no rhonchi. She has no rales. Right breast exhibits no inverted nipple, no mass, no nipple discharge, no skin change and no tenderness. Left breast exhibits no inverted nipple, no mass, no nipple discharge, no skin change and no tenderness. Breasts are symmetrical.  Abdominal: Soft. Normal aorta and bowel sounds are normal. She exhibits no mass. There is no hepatosplenomegaly. There is no tenderness. There is no rebound, no guarding and no CVA tenderness.  Musculoskeletal: Normal range of motion. She exhibits no edema.  Lymphadenopathy:       Head (right side): No submandibular adenopathy present.       Head (left side): No submandibular adenopathy present.    She has no cervical adenopathy.  Neurological: She is alert and oriented to person, place, and time. She has normal sensation, normal strength, normal reflexes and intact cranial nerves.  Skin: Skin is warm and dry. No rash noted. She is not diaphoretic. No erythema. There is pallor.  Psychiatric: Mood and affect normal.  Nursing note and vitals  reviewed.     Assessment & Plan  Problem List Items Addressed This Visit    None    Visit Diagnoses    Annual physical exam    -  Primary   Relevant Orders   CBC with Differential/Platelet   Hepatic Function Panel (6)   TSH   HIV antibody   POCT urinalysis dipstick (Completed)   Renal Function Panel   Weight loss       Relevant Orders   CBC with Differential/Platelet   Hepatic Function Panel (6)   TSH   HIV antibody   Hepatitis C antibody   POCT urinalysis dipstick (Completed)   Renal Function Panel   Need for hepatitis C screening test       Relevant Orders   Hepatitis C antibody   Encounter for screening for HIV       Relevant Orders   HIV antibody   Breast screening       Relevant Orders   MM Digital Screening   Acute cystitis with hematuria       Relevant Medications   sulfamethoxazole-trimethoprim (BACTRIM DS,SEPTRA DS) 800-160 MG tablet  Meds ordered this encounter  Medications  . sulfamethoxazole-trimethoprim (BACTRIM DS,SEPTRA DS) 800-160 MG tablet    Sig: Take 1 tablet by mouth 2 (two) times daily.    Dispense:  6 tablet    Refill:  0      Dr. [facility]  03/19/17

## 2017-03-20 LAB — CBC WITH DIFFERENTIAL/PLATELET
BASOS ABS: 0.1 10*3/uL (ref 0.0–0.2)
Basos: 2 %
EOS (ABSOLUTE): 0.2 10*3/uL (ref 0.0–0.4)
Eos: 4 %
HEMOGLOBIN: 12.8 g/dL (ref 11.1–15.9)
Hematocrit: 39.1 % (ref 34.0–46.6)
Immature Grans (Abs): 0 10*3/uL (ref 0.0–0.1)
Immature Granulocytes: 0 %
LYMPHS ABS: 1.8 10*3/uL (ref 0.7–3.1)
Lymphs: 35 %
MCH: 28.4 pg (ref 26.6–33.0)
MCHC: 32.7 g/dL (ref 31.5–35.7)
MCV: 87 fL (ref 79–97)
MONOCYTES: 8 %
Monocytes Absolute: 0.4 10*3/uL (ref 0.1–0.9)
NEUTROS ABS: 2.6 10*3/uL (ref 1.4–7.0)
Neutrophils: 51 %
Platelets: 220 10*3/uL (ref 150–379)
RBC: 4.51 x10E6/uL (ref 3.77–5.28)
RDW: 13.8 % (ref 12.3–15.4)
WBC: 5.1 10*3/uL (ref 3.4–10.8)

## 2017-03-20 LAB — HEPATIC FUNCTION PANEL (6)
ALK PHOS: 91 IU/L (ref 39–117)
ALT: 18 IU/L (ref 0–32)
AST: 18 IU/L (ref 0–40)
Bilirubin Total: 0.7 mg/dL (ref 0.0–1.2)
Bilirubin, Direct: 0.17 mg/dL (ref 0.00–0.40)

## 2017-03-20 LAB — HEPATITIS C ANTIBODY: Hep C Virus Ab: 0.1 s/co ratio (ref 0.0–0.9)

## 2017-03-20 LAB — RENAL FUNCTION PANEL
Albumin: 4.8 g/dL (ref 3.6–4.8)
BUN / CREAT RATIO: 20 (ref 12–28)
BUN: 13 mg/dL (ref 8–27)
CO2: 24 mmol/L (ref 20–29)
Calcium: 9.5 mg/dL (ref 8.7–10.3)
Chloride: 101 mmol/L (ref 96–106)
Creatinine, Ser: 0.66 mg/dL (ref 0.57–1.00)
GFR calc Af Amer: 111 mL/min/{1.73_m2} (ref >59)
GFR, EST NON AFRICAN AMERICAN: 96 mL/min/{1.73_m2} (ref >59)
Glucose: 70 mg/dL (ref 65–99)
PHOSPHORUS: 3.6 mg/dL (ref 2.5–4.5)
POTASSIUM: 4.2 mmol/L (ref 3.5–5.2)
SODIUM: 140 mmol/L (ref 134–144)

## 2017-03-20 LAB — TSH: TSH: 1.27 u[IU]/mL (ref 0.450–4.500)

## 2017-03-20 LAB — HIV ANTIBODY (ROUTINE TESTING W REFLEX): HIV SCREEN 4TH GENERATION: NONREACTIVE

## 2017-04-02 ENCOUNTER — Other Ambulatory Visit: Payer: Self-pay | Admitting: Family Medicine

## 2017-04-02 ENCOUNTER — Ambulatory Visit
Admission: RE | Admit: 2017-04-02 | Discharge: 2017-04-02 | Disposition: A | Discharge status: Home / Self Care | Payer: BC Managed Care – PPO | Source: Ambulatory Visit | Attending: Family Medicine | Admitting: Family Medicine

## 2017-04-02 DIAGNOSIS — Z1239 Encounter for other screening for malignant neoplasm of breast: Secondary | ICD-10-CM

## 2017-04-02 DIAGNOSIS — Z1231 Encounter for screening mammogram for malignant neoplasm of breast: Secondary | ICD-10-CM | POA: Diagnosis not present

## 2017-05-10 ENCOUNTER — Other Ambulatory Visit: Payer: Self-pay

## 2017-05-10 ENCOUNTER — Telehealth: Payer: Self-pay

## 2017-05-10 DIAGNOSIS — N3001 Acute cystitis with hematuria: Secondary | ICD-10-CM

## 2017-05-10 MED ORDER — SULFAMETHOXAZOLE-TRIMETHOPRIM 800-160 MG PO TABS: 1.0000 | ORAL_TABLET | Freq: Two times a day (BID) | ORAL | 0 refills | Status: DC

## 2017-05-10 NOTE — Telephone Encounter (Signed)
Pt called- wants refill on Sulfa/ has uti again and was just seen for this issue- sent in 7 days to [facility]- need to see if not better

## 2017-05-23 ENCOUNTER — Other Ambulatory Visit: Payer: Self-pay

## 2017-10-03 ENCOUNTER — Other Ambulatory Visit: Payer: Self-pay

## 2017-10-08 ENCOUNTER — Ambulatory Visit (INDEPENDENT_AMBULATORY_CARE_PROVIDER_SITE_OTHER): Payer: BC Managed Care – PPO

## 2017-10-08 DIAGNOSIS — Z111 Encounter for screening for respiratory tuberculosis: Secondary | ICD-10-CM | POA: Diagnosis not present

## 2017-10-08 MED ORDER — TUBERCULIN PPD 5 UNIT/0.1ML ID SOLN
5.0000 [IU] | Freq: Once | INTRADERMAL | Status: AC
  Administered 2017-10-08: 5 [IU] via INTRADERMAL

## 2017-10-10 ENCOUNTER — Other Ambulatory Visit: Payer: Self-pay | Admitting: Family Medicine

## 2017-10-10 LAB — TB SKIN TEST
Induration: 0 mm
TB Skin Test: NEGATIVE

## 2018-02-01 ENCOUNTER — Encounter: Payer: Self-pay | Admitting: Family Medicine

## 2018-03-19 ENCOUNTER — Ambulatory Visit (INDEPENDENT_AMBULATORY_CARE_PROVIDER_SITE_OTHER): Payer: BC Managed Care – PPO | Admitting: Family Medicine

## 2018-03-19 ENCOUNTER — Encounter: Payer: Self-pay | Admitting: Family Medicine

## 2018-03-19 ENCOUNTER — Other Ambulatory Visit (HOSPITAL_COMMUNITY)
Admission: RE | Admit: 2018-03-19 | Discharge: 2018-03-19 | Disposition: A | Discharge status: Home / Self Care | Payer: BC Managed Care – PPO | Source: Ambulatory Visit | Attending: Family Medicine | Admitting: Family Medicine

## 2018-03-19 VITALS — BP 100/64 | HR 60 | Ht 67.0 in | Wt 115.0 lb

## 2018-03-19 DIAGNOSIS — R1900 Intra-abdominal and pelvic swelling, mass and lump, unspecified site: Secondary | ICD-10-CM | POA: Diagnosis not present

## 2018-03-19 DIAGNOSIS — Z0000 Encounter for general adult medical examination without abnormal findings: Secondary | ICD-10-CM

## 2018-03-19 DIAGNOSIS — Z1211 Encounter for screening for malignant neoplasm of colon: Secondary | ICD-10-CM

## 2018-03-19 DIAGNOSIS — Z01419 Encounter for gynecological examination (general) (routine) without abnormal findings: Secondary | ICD-10-CM | POA: Diagnosis not present

## 2018-03-19 DIAGNOSIS — Z1272 Encounter for screening for malignant neoplasm of vagina: Secondary | ICD-10-CM | POA: Insufficient documentation

## 2018-03-19 DIAGNOSIS — Z780 Asymptomatic menopausal state: Secondary | ICD-10-CM

## 2018-03-19 DIAGNOSIS — Z0184 Encounter for antibody response examination: Secondary | ICD-10-CM | POA: Diagnosis not present

## 2018-03-19 DIAGNOSIS — Z1231 Encounter for screening mammogram for malignant neoplasm of breast: Secondary | ICD-10-CM

## 2018-03-19 LAB — HEMOCCULT GUIAC POC 1CARD (OFFICE): FECAL OCCULT BLD: NEGATIVE

## 2018-03-19 LAB — RESULTS CONSOLE HPV: CHL HPV: NEGATIVE

## 2018-03-19 NOTE — Progress Notes (Signed)
Name: Margaret Kennedy   MRN: [id number]    DOB: [birth year]   Date:03/19/2018       Progress Note  Subjective  Chief Complaint  Chief Complaint  Patient presents with  . Annual Exam    wants a pap and to check for "measle immunity'    Patient is a 62 year old female who presents for a comprehensive physical exam. The patient reports the following problems: need to evaluate immune status for measles. Health maintenance has been reviewed PAP evaluation.   No problem-specific Assessment & Plan notes found for this encounter.   Past Medical History:  Diagnosis Date  . Cancer ([place])    skin  . History of cold sores     Past Surgical History:  Procedure Laterality Date  . BREAST BIOPSY Left 12/03/12   u/s bx/clip-neg  . COLONOSCOPY  2014   cleared for 5 years  . VAGINAL HYSTERECTOMY      Family History  Problem Relation Age of Onset  . Cancer Mother   . Cancer Father   . Breast cancer Daughter 61  . Cancer Daughter     Social History   Socioeconomic History  . Marital status: Married    Spouse name: Not on file  . Number of children: Not on file  . Years of education: Not on file  . Highest education level: Not on file  Occupational History  . Not on file  Social Needs  . Financial resource strain: Not on file  . Food insecurity:    Worry: Not on file    Inability: Not on file  . Transportation needs:    Medical: Not on file    Non-medical: Not on file  Tobacco Use  . Smoking status: Never Smoker  . Smokeless tobacco: Never Used  Substance and Sexual Activity  . Alcohol use: Yes    Alcohol/week: 0.0 oz  . Drug use: No  . Sexual activity: Yes  Lifestyle  . Physical activity:    Days per week: Not on file    Minutes per session: Not on file  . Stress: Not on file  Relationships  . Social connections:    Talks on phone: Not on file    Gets together: Not on file    Attends religious service: Not on file    Active member of club or organization: Not on  file    Attends meetings of clubs or organizations: Not on file    Relationship status: Not on file  . Intimate partner violence:    Fear of current or ex partner: Not on file    Emotionally abused: Not on file    Physically abused: Not on file    Forced sexual activity: Not on file  Other Topics Concern  . Not on file  Social History Narrative  . Not on file    No Known Allergies  Outpatient Medications Prior to Visit  Medication Sig Dispense Refill  . valACYclovir (VALTREX) 500 MG tablet Take 1 tablet (500 mg total) by mouth 2 (two) times daily. As needed 30 tablet 2  . docusate sodium (COLACE) 100 MG capsule Take 1 tablet once or twice daily as needed for constipation while taking narcotic pain medicine 30 capsule 0  . sulfamethoxazole-trimethoprim (BACTRIM DS,SEPTRA DS) 800-160 MG tablet Take 1 tablet by mouth 2 (two) times daily. 14 tablet 0   No facility-administered medications prior to visit.     Review of Systems  Constitutional: Negative for chills, fever,  malaise/fatigue and weight loss.  HENT: Negative for congestion, ear discharge, ear pain, hearing loss, nosebleeds, sinus pain and sore throat.   Eyes: Negative for blurred vision, double vision, photophobia, pain, discharge and redness.  Respiratory: Negative for cough, hemoptysis, sputum production, shortness of breath and wheezing.   Cardiovascular: Negative for chest pain, palpitations, orthopnea, claudication, leg swelling and PND.  Gastrointestinal: Negative for abdominal pain, blood in stool, constipation, diarrhea, heartburn, melena, nausea and vomiting.  Genitourinary: Negative for dysuria, flank pain, frequency, hematuria and urgency.  Musculoskeletal: Negative for back pain, joint pain, myalgias and neck pain.  Skin: Negative for rash.  Neurological: Negative for dizziness, tingling, sensory change, focal weakness and headaches.  Endo/Heme/Allergies: Negative for environmental allergies and polydipsia. Does  not bruise/bleed easily.  Psychiatric/Behavioral: Negative for depression and suicidal ideas. The patient is not nervous/anxious and does not have insomnia.      Objective  Vitals:   03/19/18 0833  BP: 100/64  Pulse: 60  Weight: 115 lb (52.2 kg)  Height: 5\' 7"  (1.702 m)    Physical Exam  Constitutional: Vital signs are normal. She appears well-developed and well-nourished. No distress.  HENT:  Head: Normocephalic and atraumatic.  Right Ear: Hearing, tympanic membrane, external ear and ear canal normal.  Left Ear: Hearing, tympanic membrane, external ear and ear canal normal.  Nose: Nose normal.  Mouth/Throat: Uvula is midline and oropharynx is clear and moist. No oropharyngeal exudate, posterior oropharyngeal edema or posterior oropharyngeal erythema.  Eyes: Pupils are equal, round, and reactive to light. Conjunctivae, EOM and lids are normal. Right eye exhibits no discharge. Left eye exhibits no discharge.  Fundoscopic exam:      The right eye shows no arteriolar narrowing and no AV nicking.       The left eye shows no arteriolar narrowing and no AV nicking.  Neck: Trachea normal, normal range of motion, full passive range of motion without pain and phonation normal. Neck supple. Normal carotid pulses, no hepatojugular reflux and no JVD present. Carotid bruit is not present. No thyroid mass and no thyromegaly present.  Cardiovascular: Normal rate, regular rhythm, S1 normal, S2 normal, normal heart sounds, intact distal pulses and normal pulses. PMI is not displaced. Exam reveals no gallop, no S3, no S4, no distant heart sounds and no friction rub.  No murmur heard.  No systolic murmur is present.  No diastolic murmur is present. Pulses:      Carotid pulses are 2+ on the right side, and 2+ on the left side.      Radial pulses are 2+ on the right side, and 2+ on the left side.       Femoral pulses are 2+ on the right side, and 2+ on the left side.      Popliteal pulses are 2+ on  the right side, and 2+ on the left side.       Dorsalis pedis pulses are 2+ on the right side, and 2+ on the left side.       Posterior tibial pulses are 2+ on the right side, and 2+ on the left side.  Pulmonary/Chest: Effort normal and breath sounds normal. No stridor. She has no decreased breath sounds. She has no wheezes.  Abdominal: Soft. Bowel sounds are normal. She exhibits pulsatile midline mass. She exhibits no mass. There is no hepatosplenomegaly. There is no tenderness. There is no guarding and no CVA tenderness.    Genitourinary: Rectum normal and vagina normal. Rectal exam shows no mass and  guaiac negative stool. Pelvic exam was performed with patient supine. There is no rash, tenderness or lesion on the right labia. There is no rash, tenderness or lesion on the left labia. Right adnexum displays no mass, no tenderness and no fullness. Left adnexum displays no mass, no tenderness and no fullness.  Genitourinary Comments: hysterectomy  Musculoskeletal: Normal range of motion. She exhibits no edema.       Cervical back: Normal.       Thoracic back: Normal.       Lumbar back: Normal.  Lymphadenopathy:       Head (right side): No submental and no submandibular adenopathy present.       Head (left side): No submental and no submandibular adenopathy present.    She has no cervical adenopathy.    She has no axillary adenopathy.  Neurological: She is alert. She has normal strength and normal reflexes. No cranial nerve deficit or sensory deficit.  Reflex Scores:      Tricep reflexes are 2+ on the right side and 2+ on the left side.      Bicep reflexes are 2+ on the right side and 2+ on the left side.      Brachioradialis reflexes are 2+ on the right side and 2+ on the left side.      Patellar reflexes are 2+ on the right side and 2+ on the left side.      Achilles reflexes are 2+ on the right side and 2+ on the left side. Skin: Skin is warm and dry. Capillary refill takes less than 2  seconds. No rash noted. She is not diaphoretic. No pallor.  Nursing note and vitals reviewed.     Assessment & Plan  Problem List Items Addressed This Visit    None    Visit Diagnoses    Annual physical exam    -  Primary   No subjective findings. Noted pusatile area in left side likely abdominal aorta.   Relevant Orders   Renal Function Panel   Lipid panel   POCT occult blood stool   Gynecologic exam normal       Abdominal pulsatile mass       Probable aorta/Will evaluate size with ultrasound.   Relevant Orders   US Abdomen Complete   Immunity to measles, mumps, and rubella determined by serologic test       Going on cruise. needs evaluates on immune status.   Relevant Orders   Measles/Mumps/Rubella Immunity   Screening for vaginal cancer       Relevant Orders   Human papillomavirus (HPV) high risk ([place])   Colon cancer screening       Guiaic stool /negative   Relevant Orders   POCT occult blood stool   Postmenopausal       Will check dexascan to evaluate osteoporosis   Relevant Orders   DG Bone Density   Breast cancer screening by mammogram       Screen with mammogram   Relevant Orders   MM Digital Screening      No orders of the defined types were placed in this encounter. Margaret Kennedy is a 62 y.o. female who presents today for her Complete Annual Exam. She feels well. She reports exercising . She reports she is sleeping well.  Immunizations are reviewed and recommendations provided.   Age appropriate screening tests are discussed. Counseling given for risk factor reduction interventions.   Dr. [facility]  03/19/18

## 2018-03-20 LAB — HUMAN PAPILLOMAVIRUS (HPV) HIGH RISK (CH PATH LAB): HPV: NOT DETECTED

## 2018-03-21 ENCOUNTER — Encounter: Payer: Self-pay | Admitting: Family Medicine

## 2018-03-21 LAB — LIPID PANEL
CHOL/HDL RATIO: 3 ratio (ref 0.0–4.4)
Cholesterol, Total: 184 mg/dL (ref 100–199)
HDL: 62 mg/dL (ref >39)
LDL CALC: 107 mg/dL — AB (ref 0–99)
Triglycerides: 77 mg/dL (ref 0–149)
VLDL Cholesterol Cal: 15 mg/dL (ref 5–40)

## 2018-03-21 LAB — RENAL FUNCTION PANEL
Albumin: 4.6 g/dL (ref 3.6–4.8)
BUN / CREAT RATIO: 13 (ref 12–28)
BUN: 9 mg/dL (ref 8–27)
CO2: 22 mmol/L (ref 20–29)
CREATININE: 0.68 mg/dL (ref 0.57–1.00)
Calcium: 9.4 mg/dL (ref 8.7–10.3)
Chloride: 104 mmol/L (ref 96–106)
GFR, EST AFRICAN AMERICAN: 109 mL/min/{1.73_m2} (ref >59)
GFR, EST NON AFRICAN AMERICAN: 95 mL/min/{1.73_m2} (ref >59)
Glucose: 88 mg/dL (ref 65–99)
PHOSPHORUS: 3.6 mg/dL (ref 2.5–4.5)
POTASSIUM: 4.9 mmol/L (ref 3.5–5.2)
SODIUM: 140 mmol/L (ref 134–144)

## 2018-03-21 LAB — MEASLES/MUMPS/RUBELLA IMMUNITY
MUMPS ABS, IGG: 169 [AU]/ml (ref >10.9)
Rubella Antibodies, IGG: 16.9 index (ref >0.99)

## 2018-03-21 NOTE — Addendum Note (Signed)
Addended by: Fredderick Severance on: 03/21/2018 08:44 AM   Modules accepted: Orders

## 2018-03-25 ENCOUNTER — Ambulatory Visit
Admission: RE | Admit: 2018-03-25 | Discharge: 2018-03-25 | Disposition: A | Discharge status: Home / Self Care | Payer: BC Managed Care – PPO | Source: Ambulatory Visit | Attending: Family Medicine | Admitting: Family Medicine

## 2018-03-25 DIAGNOSIS — R1900 Intra-abdominal and pelvic swelling, mass and lump, unspecified site: Secondary | ICD-10-CM | POA: Diagnosis not present

## 2018-03-25 DIAGNOSIS — K824 Cholesterolosis of gallbladder: Secondary | ICD-10-CM | POA: Insufficient documentation

## 2018-04-03 ENCOUNTER — Other Ambulatory Visit: Payer: BC Managed Care – PPO

## 2018-04-03 ENCOUNTER — Ambulatory Visit: Payer: BC Managed Care – PPO

## 2018-04-04 ENCOUNTER — Ambulatory Visit
Admission: RE | Admit: 2018-04-04 | Discharge: 2018-04-04 | Disposition: A | Discharge status: Home / Self Care | Payer: BC Managed Care – PPO | Source: Ambulatory Visit | Attending: Family Medicine | Admitting: Family Medicine

## 2018-04-04 DIAGNOSIS — Z780 Asymptomatic menopausal state: Secondary | ICD-10-CM | POA: Insufficient documentation

## 2018-04-04 DIAGNOSIS — Z1231 Encounter for screening mammogram for malignant neoplasm of breast: Secondary | ICD-10-CM | POA: Diagnosis not present

## 2018-11-01 ENCOUNTER — Ambulatory Visit: Payer: BC Managed Care – PPO | Admitting: Family Medicine

## 2019-03-20 ENCOUNTER — Encounter: Payer: BC Managed Care – PPO | Admitting: Family Medicine

## 2019-03-20 ENCOUNTER — Telehealth: Payer: Self-pay

## 2019-03-20 NOTE — Telephone Encounter (Signed)
Spoke to daughter Kennyth Lose, pt called in stating that physical is tomorrow  (aug 7) and she wants to cancel. I called and told Kennyth Lose that mom's physical was actually today ( aug 6)and she will need to call and resched. She will be considered a no show today. Daughter stated she is at the beach, but she will give her the message

## 2019-05-05 ENCOUNTER — Encounter: Payer: BC Managed Care – PPO | Admitting: Family Medicine

## 2019-05-08 ENCOUNTER — Encounter: Payer: Self-pay | Admitting: Family Medicine

## 2019-05-08 ENCOUNTER — Other Ambulatory Visit: Payer: Self-pay

## 2019-05-08 ENCOUNTER — Ambulatory Visit (INDEPENDENT_AMBULATORY_CARE_PROVIDER_SITE_OTHER): Payer: BC Managed Care – PPO | Admitting: Family Medicine

## 2019-05-08 VITALS — BP 120/80 | HR 80 | Ht 67.0 in | Wt 123.0 lb

## 2019-05-08 DIAGNOSIS — Z0000 Encounter for general adult medical examination without abnormal findings: Secondary | ICD-10-CM | POA: Diagnosis not present

## 2019-05-08 DIAGNOSIS — Z1231 Encounter for screening mammogram for malignant neoplasm of breast: Secondary | ICD-10-CM

## 2019-05-08 NOTE — Progress Notes (Addendum)
Date:  05/08/2019   Name:  MARIAJULIA WHITTINGTON   DOB:  1955/11/07   MRN:  [id number]   Chief Complaint: Annual Exam (no pap- mammo needed)  Patient is a 63 year old female who presents for a comprehensive physical exam. The patient reports the following problems: none/arthritis. Health maintenance has been reviewed up to date.   Review of Systems  Constitutional: Negative.  Negative for chills, diaphoresis, fatigue, fever and unexpected weight change.  HENT: Negative for congestion, ear discharge, ear pain, hearing loss, mouth sores, nosebleeds, postnasal drip, rhinorrhea, sinus pressure, sneezing and sore throat.   Eyes: Negative for photophobia, pain, discharge, redness and itching.  Respiratory: Negative for cough, choking, shortness of breath, wheezing and stridor.   Cardiovascular: Negative for chest pain, palpitations and leg swelling.  Gastrointestinal: Negative for abdominal pain, blood in stool, constipation, diarrhea, nausea and vomiting.  Endocrine: Negative for cold intolerance, heat intolerance, polydipsia, polyphagia and polyuria.  Genitourinary: Negative for dysuria, flank pain, frequency, hematuria, menstrual problem, pelvic pain, urgency, vaginal bleeding and vaginal discharge.  Musculoskeletal: Positive for arthralgias. Negative for back pain and myalgias.  Skin: Negative for color change, pallor, rash and wound.  Allergic/Immunologic: Negative for environmental allergies and food allergies.  Neurological: Negative for dizziness, weakness, light-headedness, numbness and headaches.  Hematological: Negative for adenopathy. Does not bruise/bleed easily.  Psychiatric/Behavioral: Negative for dysphoric mood. The patient is not nervous/anxious.     There are no active problems to display for this patient.   No Known Allergies  Past Surgical History:  Procedure Laterality Date  . BREAST BIOPSY Left 12/03/12   u/s bx/clip-neg  . COLONOSCOPY  2014   cleared for 5 years  .  VAGINAL HYSTERECTOMY      Social History   Tobacco Use  . Smoking status: Never Smoker  . Smokeless tobacco: Never Used  Substance Use Topics  . Alcohol use: Yes    Alcohol/week: 0.0 standard drinks  . Drug use: No     Medication list has been reviewed and updated.  Current Meds  Medication Sig  . valACYclovir (VALTREX) 500 MG tablet Take 1 tablet (500 mg total) by mouth 2 (two) times daily. As needed    PHQ 2/9 Scores 05/08/2019 03/19/2018 03/19/2017 03/19/2017  PHQ - 2 Score 0 0 0 0  PHQ- 9 Score 0 0 0 -    BP Readings from Last 3 Encounters:  05/08/19 120/80  03/19/18 100/64  03/19/17 120/70    Physical Exam Vitals signs and nursing note reviewed.  Constitutional:      Appearance: Normal appearance. She is well-developed and normal weight.  HENT:     Head: Normocephalic.     Jaw: There is normal jaw occlusion.     Right Ear: Hearing, tympanic membrane, ear canal and external ear normal.     Left Ear: Hearing, tympanic membrane, ear canal and external ear normal.     Nose: Nose normal. No congestion or rhinorrhea.     Mouth/Throat:     Lips: Pink.     Mouth: Mucous membranes are moist.     Dentition: Normal dentition.     Tongue: No lesions.     Palate: No mass.  Eyes:     General: Lids are normal. Vision grossly intact. Gaze aligned appropriately. No scleral icterus.       Right eye: No foreign body, discharge or hordeolum.        Left eye: No foreign body, discharge or hordeolum.  Conjunctiva/sclera: Conjunctivae normal.     Right eye: Right conjunctiva is not injected.     Left eye: Left conjunctiva is not injected.     Pupils: Pupils are equal, round, and reactive to light.     Funduscopic exam:    Right eye: No AV nicking, arteriolar narrowing or papilledema.        Left eye: No AV nicking, arteriolar narrowing or papilledema.  Neck:     Musculoskeletal: Full passive range of motion without pain, normal range of motion and neck supple.     Thyroid: No  thyroid mass, thyromegaly or thyroid tenderness.     Vascular: Normal carotid pulses. No carotid bruit, hepatojugular reflux or JVD.     Trachea: Trachea and phonation normal. No tracheal deviation.  Cardiovascular:     Rate and Rhythm: Normal rate and regular rhythm.  No extrasystoles are present.    Chest Wall: PMI is not displaced. No thrill.     Pulses: Normal pulses.          Carotid pulses are 2+ on the right side and 2+ on the left side.      Radial pulses are 2+ on the right side and 2+ on the left side.       Femoral pulses are 2+ on the right side and 2+ on the left side.      Popliteal pulses are 2+ on the right side and 2+ on the left side.       Dorsalis pedis pulses are 2+ on the right side and 2+ on the left side.       Posterior tibial pulses are 2+ on the right side and 2+ on the left side.     Heart sounds: S1 normal and S2 normal. No murmur. No systolic murmur. No diastolic murmur. No friction rub. No gallop. No S3 or S4 sounds.   Pulmonary:     Effort: Pulmonary effort is normal. No respiratory distress.     Breath sounds: Normal breath sounds. No decreased breath sounds, wheezing, rhonchi or rales.  Chest:     Chest wall: No mass.     Breasts:        Right: Normal. No swelling, bleeding, inverted nipple, mass, nipple discharge, skin change or tenderness.        Left: Normal. No swelling, bleeding, inverted nipple, mass, nipple discharge, skin change or tenderness.  Abdominal:     General: Bowel sounds are normal.     Palpations: Abdomen is soft. There is no hepatomegaly, splenomegaly, mass or pulsatile mass.     Tenderness: There is no abdominal tenderness. There is no guarding or rebound.  Musculoskeletal: Normal range of motion.        General: No tenderness.     Cervical back: Normal.     Thoracic back: Normal.     Lumbar back: Normal.     Right lower leg: No edema.     Left lower leg: No edema.  Feet:     Right foot:     Skin integrity: Skin integrity  normal.     Left foot:     Skin integrity: Skin integrity normal.  Lymphadenopathy:     Head:     Right side of head: No submental, submandibular or tonsillar adenopathy.     Left side of head: No submental, submandibular or tonsillar adenopathy.     Cervical: No cervical adenopathy.     Right cervical: No superficial, deep or posterior cervical adenopathy.  Left cervical: No superficial, deep or posterior cervical adenopathy.     Upper Body:     Right upper body: No supraclavicular or axillary adenopathy.     Left upper body: No supraclavicular or axillary adenopathy.  Skin:    General: Skin is warm.     Capillary Refill: Capillary refill takes less than 2 seconds.     Findings: No rash.  Neurological:     General: No focal deficit present.     Mental Status: She is alert and oriented to person, place, and time.     Cranial Nerves: Cranial nerves are intact. No cranial nerve deficit.     Sensory: Sensation is intact.     Motor: Motor function is intact.     Deep Tendon Reflexes: Reflexes normal.     Reflex Scores:      Tricep reflexes are 2+ on the right side and 2+ on the left side.      Bicep reflexes are 2+ on the right side and 2+ on the left side.      Brachioradialis reflexes are 2+ on the right side and 2+ on the left side.      Patellar reflexes are 2+ on the right side and 2+ on the left side.      Achilles reflexes are 2+ on the right side and 2+ on the left side. Psychiatric:        Attention and Perception: Perception normal.        Mood and Affect: Mood and affect normal. Mood is not anxious or depressed.        Speech: Speech normal.        Behavior: Behavior normal. Behavior is cooperative.        Thought Content: Thought content normal.        Cognition and Memory: Cognition and memory normal.     Wt Readings from Last 3 Encounters:  05/08/19 123 lb (55.8 kg)  03/19/18 115 lb (52.2 kg)  03/19/17 116 lb (52.6 kg)    BP 120/80   Pulse 80   Ht 5\' 7"   (1.702 m)   Wt 123 lb (55.8 kg)   BMI 19.26 kg/m   Assessment and Plan:  1. Annual physical exam No subjective/objective concerns noted during history and physical exam.  Patient's previous encounter, labs, medications were reviewed.CORALEE FEDEWA is a 63 y.o. female who presents today for her Complete Annual Exam. She feels well. She reports exercising . She reports she is sleeping well. Immunizations are reviewed and recommendations provided.   Age appropriate screening tests are discussed. Counseling given for risk factor reduction interventions.  Will check lipid panel renal function panel. - Lipid panel - Renal Function Panel  2. Breast cancer screening by mammogram Breast exam and physical exam notes no masses or any abnormalities and no axillary adenopathy.  Will schedule for 3D screening breast. - MM 3D SCREEN BREAST BILATERAL; Future

## 2019-05-08 NOTE — Patient Instructions (Signed)

## 2019-05-09 LAB — RENAL FUNCTION PANEL
Albumin: 4.7 g/dL (ref 3.8–4.8)
BUN/Creatinine Ratio: 23 (ref 12–28)
BUN: 14 mg/dL (ref 8–27)
CO2: 23 mmol/L (ref 20–29)
Calcium: 9.3 mg/dL (ref 8.7–10.3)
Chloride: 104 mmol/L (ref 96–106)
Creatinine, Ser: 0.62 mg/dL (ref 0.57–1.00)
GFR calc Af Amer: 112 mL/min/{1.73_m2} (ref >59)
GFR calc non Af Amer: 97 mL/min/{1.73_m2} (ref >59)
Glucose: 91 mg/dL (ref 65–99)
Phosphorus: 3.7 mg/dL (ref 3.0–4.3)
Potassium: 4.7 mmol/L (ref 3.5–5.2)
Sodium: 141 mmol/L (ref 134–144)

## 2019-05-09 LAB — LIPID PANEL
Chol/HDL Ratio: 3.1 ratio (ref 0.0–4.4)
Cholesterol, Total: 198 mg/dL (ref 100–199)
HDL: 64 mg/dL (ref >39)
LDL Chol Calc (NIH): 122 mg/dL — ABNORMAL HIGH (ref 0–99)
Triglycerides: 66 mg/dL (ref 0–149)
VLDL Cholesterol Cal: 12 mg/dL (ref 5–40)

## 2019-05-13 ENCOUNTER — Other Ambulatory Visit: Payer: Self-pay

## 2019-05-13 ENCOUNTER — Encounter: Payer: Self-pay | Admitting: Family Medicine

## 2019-05-13 DIAGNOSIS — M1990 Unspecified osteoarthritis, unspecified site: Secondary | ICD-10-CM

## 2019-05-13 MED ORDER — MELOXICAM 7.5 MG PO TABS: 7.5000 mg | ORAL_TABLET | Freq: Every day | ORAL | 0 refills | Status: DC

## 2019-05-13 NOTE — Progress Notes (Unsigned)
Sent in meloxicam to [facility]

## 2019-05-21 ENCOUNTER — Other Ambulatory Visit: Payer: Self-pay

## 2019-05-21 ENCOUNTER — Ambulatory Visit
Admission: RE | Admit: 2019-05-21 | Discharge: 2019-05-21 | Disposition: A | Discharge status: Home / Self Care | Payer: BC Managed Care – PPO | Source: Ambulatory Visit | Attending: Family Medicine | Admitting: Family Medicine

## 2019-05-21 DIAGNOSIS — Z1231 Encounter for screening mammogram for malignant neoplasm of breast: Secondary | ICD-10-CM | POA: Insufficient documentation

## 2020-01-05 ENCOUNTER — Other Ambulatory Visit: Payer: Self-pay | Admitting: Family Medicine

## 2020-01-05 DIAGNOSIS — M1990 Unspecified osteoarthritis, unspecified site: Secondary | ICD-10-CM

## 2020-01-06 MED ORDER — MELOXICAM 7.5 MG PO TABS: 7.5000 mg | ORAL_TABLET | Freq: Every day | ORAL | 0 refills | Status: DC

## 2020-05-10 ENCOUNTER — Ambulatory Visit (INDEPENDENT_AMBULATORY_CARE_PROVIDER_SITE_OTHER): Payer: BC Managed Care – PPO | Admitting: Family Medicine

## 2020-05-10 ENCOUNTER — Other Ambulatory Visit: Payer: Self-pay

## 2020-05-10 ENCOUNTER — Encounter: Payer: Self-pay | Admitting: Family Medicine

## 2020-05-10 VITALS — BP 122/80 | HR 76 | Ht 67.0 in | Wt 123.0 lb

## 2020-05-10 DIAGNOSIS — Z0000 Encounter for general adult medical examination without abnormal findings: Secondary | ICD-10-CM | POA: Diagnosis not present

## 2020-05-10 DIAGNOSIS — B009 Herpesviral infection, unspecified: Secondary | ICD-10-CM | POA: Diagnosis not present

## 2020-05-10 DIAGNOSIS — Z8619 Personal history of other infectious and parasitic diseases: Secondary | ICD-10-CM | POA: Diagnosis not present

## 2020-05-10 DIAGNOSIS — R002 Palpitations: Secondary | ICD-10-CM

## 2020-05-10 DIAGNOSIS — Z1231 Encounter for screening mammogram for malignant neoplasm of breast: Secondary | ICD-10-CM | POA: Diagnosis not present

## 2020-05-10 DIAGNOSIS — M1990 Unspecified osteoarthritis, unspecified site: Secondary | ICD-10-CM

## 2020-05-10 MED ORDER — MELOXICAM 7.5 MG PO TABS: 7.5000 mg | ORAL_TABLET | Freq: Every day | ORAL | 11 refills | Status: DC

## 2020-05-10 MED ORDER — VALACYCLOVIR HCL 500 MG PO TABS: 500.0000 mg | ORAL_TABLET | Freq: Two times a day (BID) | ORAL | 2 refills | Status: DC

## 2020-05-10 NOTE — Patient Instructions (Signed)

## 2020-05-10 NOTE — Progress Notes (Signed)
Date:  05/10/2020   Name:  Margaret Kennedy   DOB:  08-Feb-1956   MRN:  [id number]   Chief Complaint: Annual Exam  Patient is a 64 year old female who presents for a comprehensive physical exam. The patient reports the following problems: none. Health maintenance has been reviewed up to date.   Lab Results  Component Value Date   CREATININE 0.62 05/08/2019   BUN 14 05/08/2019   NA 141 05/08/2019   K 4.7 05/08/2019   CL 104 05/08/2019   CO2 23 05/08/2019   Lab Results  Component Value Date   CHOL 198 05/08/2019   HDL 64 05/08/2019   LDLCALC 122 (H) 05/08/2019   TRIG 66 05/08/2019   CHOLHDL 3.1 05/08/2019   Lab Results  Component Value Date   TSH 1.270 03/19/2017   No results found for: HGBA1C Lab Results  Component Value Date   WBC 5.1 03/19/2017   HGB 12.8 03/19/2017   HCT 39.1 03/19/2017   MCV 87 03/19/2017   PLT 220 03/19/2017   Lab Results  Component Value Date   ALT 18 03/19/2017   AST 18 03/19/2017   ALKPHOS 91 03/19/2017   BILITOT 0.7 03/19/2017     Review of Systems  Constitutional: Negative.  Negative for chills, fatigue, fever and unexpected weight change.  HENT: Negative for congestion, ear discharge, ear pain, rhinorrhea, sinus pressure, sneezing and sore throat.   Eyes: Negative for photophobia, pain, discharge, redness and itching.  Respiratory: Negative for cough, shortness of breath, wheezing and stridor.   Cardiovascular: Positive for palpitations. Negative for chest pain and leg swelling.  Gastrointestinal: Negative for abdominal pain, blood in stool, constipation, diarrhea, nausea and vomiting.  Endocrine: Negative for cold intolerance, heat intolerance, polydipsia, polyphagia and polyuria.  Genitourinary: Negative for dysuria, flank pain, frequency, hematuria, menstrual problem, pelvic pain, urgency, vaginal bleeding and vaginal discharge.  Musculoskeletal: Negative for arthralgias, back pain and myalgias.  Skin: Negative for rash.    Allergic/Immunologic: Negative for environmental allergies and food allergies.  Neurological: Negative for dizziness, weakness, light-headedness, numbness and headaches.  Hematological: Negative for adenopathy. Does not bruise/bleed easily.  Psychiatric/Behavioral: Negative for dysphoric mood. The patient is not nervous/anxious.     There are no problems to display for this patient.   No Known Allergies  Past Surgical History:  Procedure Laterality Date  . BREAST BIOPSY Left 12/03/12   u/s bx/clip-neg  . COLONOSCOPY  2014   cleared for 5 years  . VAGINAL HYSTERECTOMY      Social History   Tobacco Use  . Smoking status: Never Smoker  . Smokeless tobacco: Never Used  Substance Use Topics  . Alcohol use: Yes    Alcohol/week: 0.0 standard drinks  . Drug use: No     Medication list has been reviewed and updated.  Current Meds  Medication Sig  . meloxicam (MOBIC) 7.5 MG tablet Take 1 tablet (7.5 mg total) by mouth daily.  . valACYclovir (VALTREX) 500 MG tablet Take 1 tablet (500 mg total) by mouth 2 (two) times daily. As needed    PHQ 2/9 Scores 05/10/2020 05/08/2019 03/19/2018 03/19/2017  PHQ - 2 Score 0 0 0 0  PHQ- 9 Score 0 0 0 0    GAD 7 : Generalized Anxiety Score 05/10/2020  Nervous, Anxious, on Edge 0  Control/stop worrying 0  Worry too much - different things 0  Trouble relaxing 0  Restless 0  Easily annoyed or irritable 0  Afraid -  awful might happen 0  Total GAD 7 Score 0    BP Readings from Last 3 Encounters:  05/10/20 122/80  05/08/19 120/80  03/19/18 100/64    Physical Exam Vitals and nursing note reviewed.  Constitutional:      General: She is not in acute distress.    Appearance: Normal appearance. She is well-groomed and normal weight. She is not diaphoretic.  HENT:     Head: Normocephalic and atraumatic.     Jaw: There is normal jaw occlusion.     Right Ear: Hearing, tympanic membrane, ear canal and external ear normal. There is no impacted  cerumen.     Left Ear: Hearing, tympanic membrane, ear canal and external ear normal. There is no impacted cerumen.     Nose: Nose normal. No congestion or rhinorrhea.     Mouth/Throat:     Lips: Pink.     Mouth: Mucous membranes are moist.     Pharynx: Oropharynx is clear.  Eyes:     General: Lids are normal. Vision grossly intact. Gaze aligned appropriately.        Right eye: No discharge.        Left eye: No discharge.     Conjunctiva/sclera: Conjunctivae normal.     Pupils: Pupils are equal, round, and reactive to light.     Funduscopic exam:    Right eye: Red reflex present.        Left eye: Red reflex present. Neck:     Thyroid: No thyroid mass, thyromegaly or thyroid tenderness.     Vascular: Normal carotid pulses. No carotid bruit, hepatojugular reflux or JVD.     Trachea: Trachea and phonation normal.  Cardiovascular:     Rate and Rhythm: Normal rate and regular rhythm.     Chest Wall: PMI is not displaced.     Pulses: Normal pulses.          Carotid pulses are 2+ on the right side and 2+ on the left side.      Radial pulses are 2+ on the right side and 2+ on the left side.       Femoral pulses are 2+ on the right side and 2+ on the left side.      Popliteal pulses are 2+ on the right side and 2+ on the left side.       Dorsalis pedis pulses are 2+ on the right side and 2+ on the left side.       Posterior tibial pulses are 2+ on the right side and 2+ on the left side.     Heart sounds: Normal heart sounds and S1 normal. No murmur heard.  No systolic murmur is present.  No diastolic murmur is present.  No friction rub. No gallop. No S3 or S4 sounds.   Pulmonary:     Effort: Pulmonary effort is normal.     Breath sounds: Normal breath sounds. No decreased breath sounds, wheezing, rhonchi or rales.  Chest:     Breasts:        Right: Normal. No swelling, bleeding, inverted nipple, mass, nipple discharge, skin change or tenderness.        Left: Normal. No swelling,  bleeding, inverted nipple, mass, nipple discharge, skin change or tenderness.  Abdominal:     General: Abdomen is flat. Bowel sounds are normal.     Palpations: Abdomen is soft. There is no hepatomegaly, splenomegaly or mass.     Tenderness: There is no abdominal tenderness. There  is no guarding.  Musculoskeletal:        General: Normal range of motion.     Cervical back: Normal, full passive range of motion without pain, normal range of motion and neck supple.     Thoracic back: Normal.     Lumbar back: Normal.     Right lower leg: No edema.     Left lower leg: No edema.  Lymphadenopathy:     Head:     Right side of head: No submental or submandibular adenopathy.     Left side of head: No submental or submandibular adenopathy.     Cervical: No cervical adenopathy.     Right cervical: No superficial, deep or posterior cervical adenopathy.    Left cervical: No superficial, deep or posterior cervical adenopathy.     Upper Body:     Right upper body: No supraclavicular or axillary adenopathy.     Left upper body: No supraclavicular or axillary adenopathy.  Skin:    General: Skin is warm and dry.     Capillary Refill: Capillary refill takes less than 2 seconds.  Neurological:     Mental Status: She is alert.     Cranial Nerves: Cranial nerves are intact.     Sensory: Sensation is intact.     Motor: Motor function is intact.     Deep Tendon Reflexes: Reflexes are normal and symmetric.     Reflex Scores:      Tricep reflexes are 2+ on the right side and 2+ on the left side.      Bicep reflexes are 2+ on the right side and 2+ on the left side.      Brachioradialis reflexes are 2+ on the right side and 2+ on the left side.      Patellar reflexes are 2+ on the right side and 2+ on the left side.      Achilles reflexes are 2+ on the right side and 2+ on the left side.    Wt Readings from Last 3 Encounters:  05/10/20 123 lb (55.8 kg)  05/08/19 123 lb (55.8 kg)  03/19/18 115 lb (52.2  kg)    BP 122/80   Pulse 76   Ht 5\' 7"  (1.702 m)   Wt 123 lb (55.8 kg)   BMI 19.26 kg/m   Assessment and Plan: 1. Annual physical exam No subjective/objective concerns noted during history and physical exam.  Patient's previous encounters, most recent labs, most recent imaging, and care everywhere were reviewed.FRANCESKA STRAHM is a 64 y.o. female who presents today for her Complete Annual Exam. She feels well. She reports exercising/golf . She reports she is sleeping well.  - Renal Function Panel - Lipid Panel With LDL/HDL Ratio  2. Breast cancer screening by mammogram Bimanual breast exam was unremarkable for masses or any other concerns.  We will order mammogram. 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 - valACYclovir (VALTREX) 500 MG tablet; Take 1 tablet (500 mg total) by mouth 2 (two) times daily. 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 As needed  Dispense: 30 tablet; Refill: 2  5. Arthritis Arthritis particular when playing golf.  Patient would like meloxicam on an as-needed basis  as needed. - meloxicam (MOBIC) 7.5 MG tablet; Take 1 tablet (7.5 mg total) by mouth daily.  Dispense: 30 tablet; Refill: 11  6. Palpitations Patient's been experiencing some palpitations especially in the evenings.  This is not associated with near syncope or any other concerns.  We will obtain a TSH to rule out possibility of hyperthyroidism. - TSH

## 2020-05-11 LAB — RENAL FUNCTION PANEL
Albumin: 4.8 g/dL (ref 3.8–4.8)
BUN/Creatinine Ratio: 16 (ref 12–28)
BUN: 11 mg/dL (ref 8–27)
CO2: 24 mmol/L (ref 20–29)
Calcium: 9.6 mg/dL (ref 8.7–10.3)
Chloride: 100 mmol/L (ref 96–106)
Creatinine, Ser: 0.69 mg/dL (ref 0.57–1.00)
GFR calc Af Amer: 107 mL/min/{1.73_m2} (ref >59)
GFR calc non Af Amer: 93 mL/min/{1.73_m2} (ref >59)
Glucose: 91 mg/dL (ref 65–99)
Phosphorus: 3.5 mg/dL (ref 3.0–4.3)
Potassium: 4.4 mmol/L (ref 3.5–5.2)
Sodium: 138 mmol/L (ref 134–144)

## 2020-05-11 LAB — LIPID PANEL WITH LDL/HDL RATIO
Cholesterol, Total: 221 mg/dL — ABNORMAL HIGH (ref 100–199)
HDL: 66 mg/dL (ref >39)
LDL Chol Calc (NIH): 143 mg/dL — ABNORMAL HIGH (ref 0–99)
LDL/HDL Ratio: 2.2 ratio (ref 0.0–3.2)
Triglycerides: 71 mg/dL (ref 0–149)
VLDL Cholesterol Cal: 12 mg/dL (ref 5–40)

## 2020-05-11 LAB — TSH: TSH: 1.58 u[IU]/mL (ref 0.450–4.500)

## 2020-06-03 ENCOUNTER — Encounter: Payer: Self-pay | Admitting: Family Medicine

## 2020-06-22 ENCOUNTER — Ambulatory Visit
Admission: RE | Admit: 2020-06-22 | Discharge: 2020-06-22 | Disposition: A | Discharge status: Home / Self Care | Payer: BC Managed Care – PPO | Source: Ambulatory Visit | Attending: Family Medicine | Admitting: Family Medicine

## 2020-06-22 ENCOUNTER — Other Ambulatory Visit: Payer: Self-pay

## 2020-06-22 DIAGNOSIS — Z1231 Encounter for screening mammogram for malignant neoplasm of breast: Secondary | ICD-10-CM | POA: Insufficient documentation

## 2021-03-07 ENCOUNTER — Encounter: Payer: Self-pay | Admitting: Family Medicine

## 2021-05-13 ENCOUNTER — Other Ambulatory Visit: Payer: Self-pay | Admitting: Family Medicine

## 2021-05-16 ENCOUNTER — Encounter: Payer: BC Managed Care – PPO | Admitting: Family Medicine

## 2021-05-23 ENCOUNTER — Other Ambulatory Visit: Payer: Self-pay

## 2021-05-23 ENCOUNTER — Encounter: Payer: Self-pay | Admitting: Family Medicine

## 2021-05-23 ENCOUNTER — Ambulatory Visit (INDEPENDENT_AMBULATORY_CARE_PROVIDER_SITE_OTHER): Payer: BC Managed Care – PPO | Admitting: Family Medicine

## 2021-05-23 VITALS — BP 124/78 | HR 68 | Ht 67.0 in | Wt 126.0 lb

## 2021-05-23 DIAGNOSIS — R002 Palpitations: Secondary | ICD-10-CM | POA: Diagnosis not present

## 2021-05-23 DIAGNOSIS — Z23 Encounter for immunization: Secondary | ICD-10-CM

## 2021-05-23 DIAGNOSIS — Z8619 Personal history of other infectious and parasitic diseases: Secondary | ICD-10-CM

## 2021-05-23 DIAGNOSIS — B356 Tinea cruris: Secondary | ICD-10-CM

## 2021-05-23 DIAGNOSIS — Z0000 Encounter for general adult medical examination without abnormal findings: Secondary | ICD-10-CM | POA: Diagnosis not present

## 2021-05-23 DIAGNOSIS — M1990 Unspecified osteoarthritis, unspecified site: Secondary | ICD-10-CM

## 2021-05-23 DIAGNOSIS — E7801 Familial hypercholesterolemia: Secondary | ICD-10-CM | POA: Diagnosis not present

## 2021-05-23 DIAGNOSIS — B009 Herpesviral infection, unspecified: Secondary | ICD-10-CM

## 2021-05-23 LAB — POCT URINALYSIS DIPSTICK
Bilirubin, UA: NEGATIVE
Blood, UA: NEGATIVE
Glucose, UA: NEGATIVE
Ketones, UA: NEGATIVE
Leukocytes, UA: NEGATIVE
Nitrite, UA: NEGATIVE
Protein, UA: NEGATIVE
Spec Grav, UA: 1.015 (ref 1.010–1.025)
Urobilinogen, UA: 0.2 E.U./dL
pH, UA: 6 (ref 5.0–8.0)

## 2021-05-23 MED ORDER — MELOXICAM 7.5 MG PO TABS: 7.5000 mg | ORAL_TABLET | Freq: Every day | ORAL | 11 refills | Status: AC

## 2021-05-23 MED ORDER — CLOTRIMAZOLE-BETAMETHASONE 1-0.05 % EX CREA: 1.0000 "application " | TOPICAL_CREAM | Freq: Two times a day (BID) | CUTANEOUS | 0 refills | Status: DC

## 2021-05-23 MED ORDER — VALACYCLOVIR HCL 500 MG PO TABS: 500.0000 mg | ORAL_TABLET | Freq: Two times a day (BID) | ORAL | 2 refills | Status: DC

## 2021-05-23 NOTE — Progress Notes (Addendum)
Date:  05/23/2021   Name:  Margaret Kennedy   DOB:  11-16-1955   MRN:  [id number]   Chief Complaint: Annual Exam (Breast Exam. No pap today. )  Patient is a 65 year old female who presents for a comprehensive physical exam. The patient reports the following problems: tachycardia/rash. Health maintenance has been reviewed mammogram.     Lab Results  Component Value Date   CREATININE 0.69 05/10/2020   BUN 11 05/10/2020   NA 138 05/10/2020   K 4.4 05/10/2020   CL 100 05/10/2020   CO2 24 05/10/2020   Lab Results  Component Value Date   CHOL 221 (H) 05/10/2020   HDL 66 05/10/2020   LDLCALC 143 (H) 05/10/2020   TRIG 71 05/10/2020   CHOLHDL 3.1 05/08/2019   Lab Results  Component Value Date   TSH 1.580 05/10/2020   No results found for: HGBA1C Lab Results  Component Value Date   WBC 5.1 03/19/2017   HGB 12.8 03/19/2017   HCT 39.1 03/19/2017   MCV 87 03/19/2017   PLT 220 03/19/2017   Lab Results  Component Value Date   ALT 18 03/19/2017   AST 18 03/19/2017   ALKPHOS 91 03/19/2017   BILITOT 0.7 03/19/2017     Review of Systems  Constitutional:  Negative for chills and fever.  HENT:  Negative for drooling, ear discharge, ear pain and sore throat.   Respiratory:  Negative for cough, shortness of breath and wheezing.   Cardiovascular:  Positive for palpitations. Negative for chest pain and leg swelling.  Gastrointestinal:  Negative for abdominal pain, blood in stool, constipation, diarrhea and nausea.  Endocrine: Negative for polydipsia.  Genitourinary:  Negative for dysuria, frequency, hematuria and urgency.  Musculoskeletal:  Negative for back pain, myalgias and neck pain.  Skin:  Negative for rash.  Allergic/Immunologic: Negative for environmental allergies.  Neurological:  Negative for dizziness and headaches.  Hematological:  Negative for adenopathy. Does not bruise/bleed easily.  Psychiatric/Behavioral:  Negative for suicidal ideas. The patient is not  nervous/anxious.    There are no problems to display for this patient.   No Known Allergies  Past Surgical History:  Procedure Laterality Date   BREAST BIOPSY Left 12/03/12   u/s bx/clip-neg   COLONOSCOPY  2014   cleared for 5 years   VAGINAL HYSTERECTOMY      Social History   Tobacco Use   Smoking status: Never   Smokeless tobacco: Never  Substance Use Topics   Alcohol use: Yes    Alcohol/week: 0.0 standard drinks   Drug use: No     Medication list has been reviewed and updated.  No outpatient medications have been marked as taking for the 05/23/21 encounter (Office Visit) with Juline Patch, MD.    [facility] 2/9 Scores 05/10/2020 05/08/2019 03/19/2018 03/19/2017  PHQ - 2 Score 0 0 0 0  PHQ- 9 Score 0 0 0 0    GAD 7 : Generalized Anxiety Score 05/10/2020  Nervous, Anxious, on Edge 0  Control/stop worrying 0  Worry too much - different things 0  Trouble relaxing 0  Restless 0  Easily annoyed or irritable 0  Afraid - awful might happen 0  Total GAD 7 Score 0    BP Readings from Last 3 Encounters:  05/10/20 122/80  05/08/19 120/80  03/19/18 100/64    Physical Exam Vitals and nursing note reviewed.  Constitutional:      General: She is not in acute distress.  Appearance: Normal appearance. She is well-groomed and normal weight. She is not diaphoretic.  HENT:     Head: Normocephalic and atraumatic.     Jaw: There is normal jaw occlusion.     Right Ear: Hearing, tympanic membrane, ear canal and external ear normal.     Left Ear: Hearing, tympanic membrane, ear canal and external ear normal.     Nose: Nose normal. No congestion.     Mouth/Throat:     Lips: Pink.     Mouth: Mucous membranes are moist.     Palate: No mass and lesions.     Pharynx: Oropharynx is clear. Uvula midline. No oropharyngeal exudate or posterior oropharyngeal erythema.     Tonsils: No tonsillar exudate.  Eyes:     General: Lids are normal. Vision grossly intact. Gaze aligned  appropriately.        Right eye: No discharge.        Left eye: No discharge.     Extraocular Movements: Extraocular movements intact.     Right eye: Normal extraocular motion.     Left eye: Normal extraocular motion.     Conjunctiva/sclera: Conjunctivae normal.     Pupils: Pupils are equal, round, and reactive to light.     Funduscopic exam:    Right eye: Red reflex present.        Left eye: Red reflex present. Neck:     Thyroid: No thyroid mass, thyromegaly or thyroid tenderness.     Vascular: Normal carotid pulses. No carotid bruit, hepatojugular reflux or JVD.     Trachea: Trachea and phonation normal.  Cardiovascular:     Rate and Rhythm: Normal rate and regular rhythm.     Pulses:          Carotid pulses are 2+ on the right side and 2+ on the left side.      Radial pulses are 2+ on the right side.       Femoral pulses are 2+ on the right side and 2+ on the left side.      Dorsalis pedis pulses are 2+ on the right side and 2+ on the left side.       Posterior tibial pulses are 2+ on the right side and 2+ on the left side.     Heart sounds: Normal heart sounds, S1 normal and S2 normal. No murmur heard. No systolic murmur is present.  No diastolic murmur is present.    No friction rub. No gallop. No S3 or S4 sounds.  Pulmonary:     Effort: Pulmonary effort is normal. No respiratory distress.     Breath sounds: Normal breath sounds. No stridor or transmitted upper airway sounds. No decreased breath sounds, wheezing, rhonchi or rales.  Chest:     Chest wall: No tenderness.  Breasts:    Right: Normal. No swelling, bleeding, inverted nipple, mass, nipple discharge, skin change or tenderness.     Left: Normal. No swelling, bleeding, inverted nipple, mass, nipple discharge, skin change or tenderness.  Abdominal:     General: Abdomen is flat. Bowel sounds are normal. There is no abdominal bruit.     Palpations: Abdomen is soft. There is no hepatomegaly, splenomegaly or mass.      Tenderness: There is no abdominal tenderness. There is no guarding.     Hernia: There is no hernia in the umbilical area, ventral area, left inguinal area or right inguinal area.  Genitourinary:    Rectum: Normal. Guaiac result negative. No mass  or tenderness.  Musculoskeletal:        General: Normal range of motion.     Cervical back: Full passive range of motion without pain, normal range of motion and neck supple.     Right lower leg: No edema.     Left lower leg: No edema.  Lymphadenopathy:     Cervical: No cervical adenopathy.     Right cervical: No superficial, deep or posterior cervical adenopathy.    Left cervical: No superficial, deep or posterior cervical adenopathy.     Upper Body:     Right upper body: No supraclavicular or axillary adenopathy.     Left upper body: No supraclavicular or axillary adenopathy.  Skin:    General: Skin is warm and dry.     Capillary Refill: Capillary refill takes less than 2 seconds.     Findings: No rash.     Nails: There is no clubbing.  Neurological:     Mental Status: She is alert.     Cranial Nerves: Cranial nerves are intact.     Sensory: Sensation is intact.     Motor: Motor function is intact.     Deep Tendon Reflexes: Reflexes are normal and symmetric.  Psychiatric:        Behavior: Behavior is cooperative.    Wt Readings from Last 3 Encounters:  05/10/20 123 lb (55.8 kg)  05/08/19 123 lb (55.8 kg)  03/19/18 115 lb (52.2 kg)    Ht 5\' 7"  (1.702 m)   BMI 19.26 kg/m   Assessment and Plan: Margaret Kennedy is a 65 y.o. female who presents today for her Complete Annual Exam. She feels well. She reports exercising as permissible. She reports she is sleeping fairly well.  Patient's chart was reviewed for previous encounters most recent labs most recent imaging in [place]. 1. Annual physical exam Immunizations are reviewed and recommendations provided.   Age appropriate screening tests are discussed. Counseling given for risk  factor reduction interventions.  No subjective/objective concerns noted during HPI, review of systems, and care everywhere.  No subjective/objective concerns noted during history and physical exam.  We will obtain renal function panel. - POCT Urinalysis Dipstick - Renal Function Panel  2. Palpitations Patient has had palpitations for the better part of her life.  These come and go and are described as tachycardic in nature.  We will obtain a an EKG for evaluation: Patient has sinus bradycardia which is regular with a rate of 55.  There is no ST-T wave changes noted thank QT interval is appropriate and other intervals are normal.  No evidence of ischemic changes such as Q waves, ST-T wave changes, nor delay in R wave progression.  She did discontinue her neck step would be to consider Holter monitor and patient is well call me if necessary. - EKG 12-Lead - Renal Function Panel  3. Arthritis Chronic.  Controlled.  Stable.  Refill meloxicam 7.5 mg as needed. - meloxicam (MOBIC) 7.5 MG tablet; Take 1 tablet (7.5 mg total) by mouth daily.  Dispense: 30 tablet; Refill: 11  4. Familial hypercholesterolemia Chronic.  Controlled.  Stable.  Patient is currently on dietary control.  We will continue to monitor with lipid panel. - Lipid Panel With LDL/HDL Ratio  5. Tinea inguinalis New onset.  Persistent.  This is an area in the creases of the inguinal area consistent with tinea/candidiasis.  We will treat with clotrimazole betamethasone apply twice a day. - clotrimazole-betamethasone (LOTRISONE) cream; Apply 1 application topically  2 (two) times daily.  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 - valACYclovir (VALTREX) 500 MG tablet; Take 1 tablet (500 mg total) by mouth 2 (two) times daily. 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 Need for  immunization against influenza Discussed and administered. - Flu Vaccine QUAD 34mo+IM (Fluarix, Fluzone & Alfiuria Quad PF)

## 2021-05-24 LAB — LIPID PANEL WITH LDL/HDL RATIO
Cholesterol, Total: 209 mg/dL — ABNORMAL HIGH (ref 100–199)
HDL: 57 mg/dL (ref >39)
LDL Chol Calc (NIH): 136 mg/dL — ABNORMAL HIGH (ref 0–99)
LDL/HDL Ratio: 2.4 ratio (ref 0.0–3.2)
Triglycerides: 87 mg/dL (ref 0–149)
VLDL Cholesterol Cal: 16 mg/dL (ref 5–40)

## 2021-05-24 LAB — RENAL FUNCTION PANEL
Albumin: 4.6 g/dL (ref 3.8–4.8)
BUN/Creatinine Ratio: 13 (ref 12–28)
BUN: 10 mg/dL (ref 8–27)
CO2: 24 mmol/L (ref 20–29)
Calcium: 9.5 mg/dL (ref 8.7–10.3)
Chloride: 103 mmol/L (ref 96–106)
Creatinine, Ser: 0.76 mg/dL (ref 0.57–1.00)
Glucose: 92 mg/dL (ref 70–99)
Phosphorus: 4.3 mg/dL (ref 3.0–4.3)
Potassium: 4.8 mmol/L (ref 3.5–5.2)
Sodium: 140 mmol/L (ref 134–144)
eGFR: 87 mL/min/{1.73_m2} (ref >59)

## 2021-07-25 ENCOUNTER — Other Ambulatory Visit: Payer: Self-pay | Admitting: Family Medicine

## 2021-07-25 DIAGNOSIS — Z1231 Encounter for screening mammogram for malignant neoplasm of breast: Secondary | ICD-10-CM

## 2021-08-03 ENCOUNTER — Ambulatory Visit
Admission: RE | Admit: 2021-08-03 | Discharge: 2021-08-03 | Disposition: A | Discharge status: Home / Self Care | Payer: BC Managed Care – PPO | Source: Ambulatory Visit | Attending: Family Medicine | Admitting: Family Medicine

## 2021-08-03 ENCOUNTER — Other Ambulatory Visit: Payer: Self-pay

## 2021-08-03 DIAGNOSIS — Z1231 Encounter for screening mammogram for malignant neoplasm of breast: Secondary | ICD-10-CM | POA: Diagnosis present

## 2021-09-21 ENCOUNTER — Encounter: Payer: Self-pay | Admitting: Family Medicine

## 2021-09-29 DIAGNOSIS — G459 Transient cerebral ischemic attack, unspecified: Secondary | ICD-10-CM | POA: Insufficient documentation

## 2021-09-29 DIAGNOSIS — I480 Paroxysmal atrial fibrillation: Secondary | ICD-10-CM | POA: Insufficient documentation

## 2022-05-24 ENCOUNTER — Other Ambulatory Visit: Payer: Self-pay | Admitting: Family Medicine

## 2022-05-24 ENCOUNTER — Encounter: Payer: Self-pay | Admitting: Family Medicine

## 2022-05-24 ENCOUNTER — Ambulatory Visit (INDEPENDENT_AMBULATORY_CARE_PROVIDER_SITE_OTHER): Payer: Medicare PPO | Admitting: Family Medicine

## 2022-05-24 VITALS — BP 118/78 | HR 80 | Ht 67.0 in | Wt 125.0 lb

## 2022-05-24 DIAGNOSIS — Z1231 Encounter for screening mammogram for malignant neoplasm of breast: Secondary | ICD-10-CM | POA: Diagnosis not present

## 2022-05-24 DIAGNOSIS — B356 Tinea cruris: Secondary | ICD-10-CM

## 2022-05-24 DIAGNOSIS — Z23 Encounter for immunization: Secondary | ICD-10-CM

## 2022-05-24 DIAGNOSIS — Z0000 Encounter for general adult medical examination without abnormal findings: Secondary | ICD-10-CM | POA: Diagnosis not present

## 2022-05-24 LAB — HEMOCCULT GUIAC POC 1CARD (OFFICE): Fecal Occult Blood, POC: NEGATIVE

## 2022-05-24 NOTE — Progress Notes (Signed)
Date:  05/24/2022   Name:  Margaret Kennedy   DOB:  1955/11/08   MRN:  [id number]   Chief Complaint: Annual Exam and Flu Vaccine  Patient is a 66 year old female who presents for a comprehensive physical exam. The patient reports the following problems: none. Health maintenance has been reviewed mammogram.Margaret Kennedy is a 66 y.o. female who presents today for her Complete Annual Exam. She feels well. She reports exercising . She reports she is sleeping fairly well.( Tachycardia/AF reponse)...        Lab Results  Component Value Date   NA 140 05/23/2021   K 4.8 05/23/2021   CO2 24 05/23/2021   GLUCOSE 92 05/23/2021   BUN 10 05/23/2021   CREATININE 0.76 05/23/2021   CALCIUM 9.5 05/23/2021   EGFR 87 05/23/2021   GFRNONAA 93 05/10/2020   Lab Results  Component Value Date   CHOL 209 (H) 05/23/2021   HDL 57 05/23/2021   LDLCALC 136 (H) 05/23/2021   TRIG 87 05/23/2021   CHOLHDL 3.1 05/08/2019   Lab Results  Component Value Date   TSH 1.580 05/10/2020   No results found for: "HGBA1C" Lab Results  Component Value Date   WBC 5.1 03/19/2017   HGB 12.8 03/19/2017   HCT 39.1 03/19/2017   MCV 87 03/19/2017   PLT 220 03/19/2017   Lab Results  Component Value Date   ALT 18 03/19/2017   AST 18 03/19/2017   ALKPHOS 91 03/19/2017   BILITOT 0.7 03/19/2017   No results found for: "25OHVITD2", "25OHVITD3", "VD25OH"   Review of Systems  Constitutional: Negative.  Negative for chills, fatigue, fever and unexpected weight change.  HENT:  Negative for congestion, ear discharge, ear pain, rhinorrhea, sinus pressure, sneezing and sore throat.   Eyes:  Negative for visual disturbance.  Respiratory:  Negative for cough, shortness of breath, wheezing and stridor.   Cardiovascular:  Positive for palpitations. Negative for chest pain and leg swelling.  Gastrointestinal:  Negative for abdominal pain, blood in stool, constipation, diarrhea and nausea.  Endocrine: Negative for polydipsia  and polyuria.  Genitourinary:  Negative for dysuria, flank pain, frequency, hematuria, urgency and vaginal discharge.  Musculoskeletal:  Negative for arthralgias, back pain and myalgias.  Skin:  Negative for rash.  Neurological:  Negative for dizziness, weakness and headaches.  Hematological:  Negative for adenopathy. Does not bruise/bleed easily.  Psychiatric/Behavioral:  Negative for dysphoric mood. The patient is not nervous/anxious.     Patient Active Problem List   Diagnosis Date Noted   Familial hypercholesterolemia 05/23/2021    No Known Allergies  Past Surgical History:  Procedure Laterality Date   BREAST BIOPSY Left 12/03/12   u/s bx/clip-neg   COLONOSCOPY  2014   cleared for 5 years   VAGINAL HYSTERECTOMY      Social History   Tobacco Use   Smoking status: Never   Smokeless tobacco: Never  Substance Use Topics   Alcohol use: Yes    Comment: weekend   Drug use: No     Medication list has been reviewed and updated.  Current Meds  Medication Sig   clotrimazole-betamethasone (LOTRISONE) cream Apply 1 application topically 2 (two) times daily.   meloxicam (MOBIC) 7.5 MG tablet Take 1 tablet (7.5 mg total) by mouth daily.   valACYclovir (VALTREX) 500 MG tablet Take 1 tablet (500 mg total) by mouth 2 (two) times daily. As needed       05/24/2022    8:46 AM 05/23/2021  8:43 AM 05/10/2020    8:32 AM  GAD 7 : Generalized Anxiety Score  Nervous, Anxious, on Edge 0 0 0  Control/stop worrying 0 0 0  Worry too much - different things 0 0 0  Trouble relaxing 0 0 0  Restless 0 0 0  Easily annoyed or irritable 0 0 0  Afraid - awful might happen 0 0 0  Total GAD 7 Score 0 0 0  Anxiety Difficulty Not difficult at all Not difficult at all        05/24/2022    8:46 AM 05/23/2021    8:42 AM 05/10/2020    8:32 AM  Depression screen PHQ 2/9  Decreased Interest 0 0 0  Down, Depressed, Hopeless 0 0 0  PHQ - 2 Score 0 0 0  Altered sleeping 0 3 0  Tired,  decreased energy 0 3 0  Change in appetite 0 0 0  Feeling bad or failure about yourself  0 0 0  Trouble concentrating 0 0 0  Moving slowly or fidgety/restless 0 0 0  Suicidal thoughts 0 0 0  PHQ-9 Score 0 6 0  Difficult doing work/chores Not difficult at all Not difficult at all     BP Readings from Last 3 Encounters:  05/24/22 118/78  05/23/21 124/78  05/10/20 122/80    Physical Exam Vitals and nursing note reviewed. Exam conducted with a chaperone present.  Constitutional:      General: She is not in acute distress.    Appearance: Normal appearance. She is well-groomed and normal weight. She is not diaphoretic.  HENT:     Head: Normocephalic and atraumatic.     Jaw: There is normal jaw occlusion.     Right Ear: Hearing, tympanic membrane, ear canal and external ear normal.     Left Ear: Hearing, tympanic membrane, ear canal and external ear normal.     Nose: Nose normal.     Mouth/Throat:     Lips: Pink.     Mouth: Mucous membranes are moist.     Dentition: Normal dentition. No dental tenderness.     Tongue: No lesions.     Palate: No mass.     Pharynx: Oropharynx is clear. Uvula midline.     Tonsils: No tonsillar exudate.  Eyes:     General: Lids are normal. Vision grossly intact. Gaze aligned appropriately. No visual field deficit.       Right eye: No discharge.        Left eye: No discharge.     Extraocular Movements: Extraocular movements intact.     Conjunctiva/sclera: Conjunctivae normal.     Pupils: Pupils are equal, round, and reactive to light.  Neck:     Thyroid: No thyroid mass, thyromegaly or thyroid tenderness.     Vascular: Normal carotid pulses. No carotid bruit, hepatojugular reflux or JVD.     Trachea: Trachea and phonation normal.  Cardiovascular:     Rate and Rhythm: Normal rate and regular rhythm.     Chest Wall: PMI is not displaced.     Pulses: No decreased pulses.          Carotid pulses are 1+ on the right side and 1+ on the left side.       Radial pulses are 1+ on the right side and 1+ on the left side.       Femoral pulses are 1+ on the right side and 1+ on the left side.      Popliteal  pulses are 1+ on the right side and 1+ on the left side.       Dorsalis pedis pulses are 1+ on the right side and 1+ on the left side.       Posterior tibial pulses are 1+ on the right side and 1+ on the left side.     Heart sounds: Normal heart sounds, S1 normal and S2 normal. No murmur heard.    No systolic murmur is present.     No diastolic murmur is present.     No friction rub. No gallop. No S3 or S4 sounds.  Pulmonary:     Effort: Pulmonary effort is normal.     Breath sounds: Normal breath sounds. No decreased air movement. No decreased breath sounds, wheezing, rhonchi or rales.  Chest:  Breasts:    Right: No swelling, bleeding, inverted nipple, mass, nipple discharge, skin change or tenderness.     Left: No swelling, bleeding, inverted nipple, mass, nipple discharge, skin change or tenderness.  Abdominal:     General: Bowel sounds are normal.     Palpations: Abdomen is soft. There is no hepatomegaly, splenomegaly or mass.     Tenderness: There is no abdominal tenderness. There is no guarding.     Hernia: No hernia is present.  Genitourinary:    Exam position: Knee-chest position.     Rectum: Normal. Guaiac result negative. No mass or tenderness.  Musculoskeletal:        General: Normal range of motion.     Cervical back: Normal, full passive range of motion without pain, normal range of motion and neck supple.     Thoracic back: Normal.     Lumbar back: Normal.     Right lower leg: No edema.     Left lower leg: No edema.  Lymphadenopathy:     Head:     Right side of head: No submental or submandibular adenopathy.     Left side of head: No submental or submandibular adenopathy.     Cervical: No cervical adenopathy.     Right cervical: No superficial, deep or posterior cervical adenopathy.    Left cervical: No superficial,  deep or posterior cervical adenopathy.     Upper Body:     Right upper body: No supraclavicular, axillary or pectoral adenopathy.     Left upper body: No supraclavicular, axillary or pectoral adenopathy.     Lower Body: No right inguinal adenopathy. No left inguinal adenopathy.  Skin:    General: Skin is warm and dry.     Capillary Refill: Capillary refill takes less than 2 seconds.  Neurological:     General: No focal deficit present.     Mental Status: She is alert.     Cranial Nerves: Cranial nerves 2-12 are intact.     Motor: Motor function is intact.     Coordination: Romberg sign negative.     Deep Tendon Reflexes: Reflexes are normal and symmetric.  Psychiatric:        Behavior: Behavior is cooperative.     Wt Readings from Last 3 Encounters:  05/24/22 125 lb (56.7 kg)  05/23/21 126 lb (57.2 kg)  05/10/20 123 lb (55.8 kg)    BP 118/78   Pulse 80   Ht 5' 7" (1.702 m)   Wt 125 lb (56.7 kg)   BMI 19.58 kg/m   Assessment and Plan: 1. Annual physical exam Immunizations are reviewed and recommendations provided.   Age appropriate screening tests are discussed. Counseling  given for risk factor reduction interventions.  No subjective or objective concerns noted other than patient has noted tach arrhythmia particularly at night most likely secondary to atrial fibrillation.  This was discussed with patient and call was placed with cardiology and they will discuss with Dr. Saralyn Pilar who will decide if needs to be on rate control medication and they will contact the patient.  There was no other concerns noted during HPI, review of systems, review of past medical history and medications, and physical exam. - Lipid Panel With LDL/HDL Ratio - Comprehensive Metabolic Panel (CMET) - POCT Occult Blood Stool - CBC  2. Breast cancer screening by mammogram Physical exam normal breast exam and patient was scheduled for 3D bilateral mammogram. - MM 3D SCREEN BREAST BILATERAL  3. Need for  immunization against influenza Discussed and administered. - Flu Vaccine QUAD High Dose(Fluad)     Otilio Miu, MD

## 2022-05-25 LAB — COMPREHENSIVE METABOLIC PANEL
ALT: 16 IU/L (ref 0–32)
AST: 17 IU/L (ref 0–40)
Albumin/Globulin Ratio: 2.2 (ref 1.2–2.2)
Albumin: 4.7 g/dL (ref 3.9–4.9)
Alkaline Phosphatase: 87 IU/L (ref 44–121)
BUN/Creatinine Ratio: 18 (ref 12–28)
BUN: 14 mg/dL (ref 8–27)
Bilirubin Total: 0.7 mg/dL (ref 0.0–1.2)
CO2: 21 mmol/L (ref 20–29)
Calcium: 9.5 mg/dL (ref 8.7–10.3)
Chloride: 103 mmol/L (ref 96–106)
Creatinine, Ser: 0.76 mg/dL (ref 0.57–1.00)
Globulin, Total: 2.1 g/dL (ref 1.5–4.5)
Glucose: 99 mg/dL (ref 70–99)
Potassium: 4.9 mmol/L (ref 3.5–5.2)
Sodium: 139 mmol/L (ref 134–144)
Total Protein: 6.8 g/dL (ref 6.0–8.5)
eGFR: 87 mL/min/{1.73_m2} (ref >59)

## 2022-05-25 LAB — LIPID PANEL WITH LDL/HDL RATIO
Cholesterol, Total: 241 mg/dL — ABNORMAL HIGH (ref 100–199)
HDL: 56 mg/dL (ref >39)
LDL Chol Calc (NIH): 171 mg/dL — ABNORMAL HIGH (ref 0–99)
LDL/HDL Ratio: 3.1 ratio (ref 0.0–3.2)
Triglycerides: 79 mg/dL (ref 0–149)
VLDL Cholesterol Cal: 14 mg/dL (ref 5–40)

## 2022-05-25 LAB — CBC
Hematocrit: 37.4 % (ref 34.0–46.6)
Hemoglobin: 12.2 g/dL (ref 11.1–15.9)
MCH: 28.2 pg (ref 26.6–33.0)
MCHC: 32.6 g/dL (ref 31.5–35.7)
MCV: 87 fL (ref 79–97)
Platelets: 212 10*3/uL (ref 150–450)
RBC: 4.32 x10E6/uL (ref 3.77–5.28)
RDW: 12.3 % (ref 11.7–15.4)
WBC: 7.5 10*3/uL (ref 3.4–10.8)

## 2022-05-25 MED ORDER — CLOTRIMAZOLE-BETAMETHASONE 1-0.05 % EX CREA: 1.0000 | TOPICAL_CREAM | Freq: Two times a day (BID) | CUTANEOUS | 0 refills | Status: DC

## 2022-06-03 ENCOUNTER — Encounter: Payer: Self-pay | Admitting: Family Medicine

## 2022-06-06 ENCOUNTER — Other Ambulatory Visit: Payer: Self-pay

## 2022-06-06 DIAGNOSIS — E782 Mixed hyperlipidemia: Secondary | ICD-10-CM

## 2022-06-06 MED ORDER — ROSUVASTATIN CALCIUM 5 MG PO TABS: 5.0000 mg | ORAL_TABLET | Freq: Every day | ORAL | 1 refills | Status: DC

## 2022-06-06 NOTE — Progress Notes (Signed)
Sent in rosuvastatin '5mg'$  daily

## 2022-06-11 ENCOUNTER — Encounter: Payer: Self-pay | Admitting: Family Medicine

## 2022-06-12 ENCOUNTER — Ambulatory Visit: Payer: Medicare PPO | Admitting: Family Medicine

## 2022-06-12 ENCOUNTER — Encounter: Payer: Self-pay | Admitting: Family Medicine

## 2022-06-12 VITALS — BP 110/52 | HR 52 | Ht 67.0 in | Wt 129.0 lb

## 2022-06-12 DIAGNOSIS — H1031 Unspecified acute conjunctivitis, right eye: Secondary | ICD-10-CM | POA: Diagnosis not present

## 2022-06-12 MED ORDER — CIPROFLOXACIN HCL 0.3 % OP SOLN: 1.0000 [drp] | OPHTHALMIC | 0 refills | Status: DC

## 2022-06-12 NOTE — Progress Notes (Signed)
Date:  06/12/2022   Name:  Margaret Kennedy   DOB:  [birth year]   MRN:  [id number]   Chief Complaint: Conjunctivitis (R) eye- crusted over this am, itchy)  Conjunctivitis  The current episode started 2 days ago. The onset was sudden. The problem occurs continuously. The problem has been unchanged. The problem is mild. Nothing (older presciption sodium sulamyd) relieves the symptoms. Associated symptoms include eye itching, eye discharge and eye redness. Pertinent negatives include no fever, no decreased vision and no photophobia. The eye pain is mild. The right eye is affected. The eye pain is not associated with movement. The eyelid exhibits redness.    Lab Results  Component Value Date   NA 139 05/24/2022   K 4.9 05/24/2022   CO2 21 05/24/2022   GLUCOSE 99 05/24/2022   BUN 14 05/24/2022   CREATININE 0.76 05/24/2022   CALCIUM 9.5 05/24/2022   EGFR 87 05/24/2022   GFRNONAA 93 05/10/2020   Lab Results  Component Value Date   CHOL 241 (H) 05/24/2022   HDL 56 05/24/2022   LDLCALC 171 (H) 05/24/2022   TRIG 79 05/24/2022   CHOLHDL 3.1 05/08/2019   Lab Results  Component Value Date   TSH 1.580 05/10/2020   No results found for: "HGBA1C" Lab Results  Component Value Date   WBC 7.5 05/24/2022   HGB 12.2 05/24/2022   HCT 37.4 05/24/2022   MCV 87 05/24/2022   PLT 212 05/24/2022   Lab Results  Component Value Date   ALT 16 05/24/2022   AST 17 05/24/2022   ALKPHOS 87 05/24/2022   BILITOT 0.7 05/24/2022   No results found for: "25OHVITD2", "25OHVITD3", "VD25OH"   Review of Systems  Constitutional:  Negative for fever.  Eyes:  Positive for discharge, redness and itching. Negative for photophobia.    Patient Active Problem List   Diagnosis Date Noted   Familial hypercholesterolemia 05/23/2021    No Known Allergies  Past Surgical History:  Procedure Laterality Date   BREAST BIOPSY Left 12/03/12   u/s bx/clip-neg   COLONOSCOPY  2014   cleared for 5 years    VAGINAL HYSTERECTOMY      Social History   Tobacco Use   Smoking status: Never   Smokeless tobacco: Never  Substance Use Topics   Alcohol use: Yes    Comment: weekend   Drug use: No     Medication list has been reviewed and updated.  Current Meds  Medication Sig   apixaban (ELIQUIS) 5 MG TABS tablet Take 1 tablet by mouth 2 (two) times daily.   clotrimazole-betamethasone (LOTRISONE) cream Apply 1 Application topically 2 (two) times daily.   meloxicam (MOBIC) 7.5 MG tablet Take 1 tablet (7.5 mg total) by mouth daily.   rosuvastatin (CRESTOR) 5 MG tablet Take 1 tablet (5 mg total) by mouth daily.   valACYclovir (VALTREX) 500 MG tablet Take 1 tablet (500 mg total) by mouth 2 (two) times daily. As needed       06/12/2022    8:28 AM 05/24/2022    8:46 AM 05/23/2021    8:43 AM 05/10/2020    8:32 AM  GAD 7 : Generalized Anxiety Score  Nervous, Anxious, on Edge 0 0 0 0  Control/stop worrying 0 0 0 0  Worry too much - different things 0 0 0 0  Trouble relaxing 0 0 0 0  Restless 0 0 0 0  Easily annoyed or irritable 0 0 0 0  Afraid - awful might  happen 0 0 0 0  Total GAD 7 Score 0 0 0 0  Anxiety Difficulty Not difficult at all Not difficult at all Not difficult at all        06/12/2022    8:27 AM 05/24/2022    8:46 AM 05/23/2021    8:42 AM  Depression screen PHQ 2/9  Decreased Interest 0 0 0  Down, Depressed, Hopeless 0 0 0  PHQ - 2 Score 0 0 0  Altered sleeping 0 0 3  Tired, decreased energy 0 0 3  Change in appetite 0 0 0  Feeling bad or failure about yourself  0 0 0  Trouble concentrating 0 0 0  Moving slowly or fidgety/restless 0 0 0  Suicidal thoughts 0 0 0  PHQ-9 Score 0 0 6  Difficult doing work/chores Not difficult at all Not difficult at all Not difficult at all    BP Readings from Last 3 Encounters:  06/12/22 (!) 110/52  05/24/22 118/78  05/23/21 124/78    Physical Exam Vitals and nursing note reviewed. Exam conducted with a chaperone present.   Constitutional:      General: She is not in acute distress.    Appearance: She is not diaphoretic.  HENT:     Head: Normocephalic and atraumatic.     Right Ear: External ear normal.     Left Ear: External ear normal.     Nose: Nose normal.     Mouth/Throat:     Mouth: Mucous membranes are moist.  Eyes:     General: Lids are normal. Lids are everted, no foreign bodies appreciated. Gaze aligned appropriately.        Right eye: No discharge.        Left eye: No discharge.     Conjunctiva/sclera:     Right eye: Right conjunctiva is injected. No chemosis.    Left eye: Left conjunctiva is not injected. No chemosis.    Pupils: Pupils are equal, round, and reactive to light.  Neck:     Thyroid: No thyromegaly.     Vascular: No JVD.  Cardiovascular:     Rate and Rhythm: Normal rate and regular rhythm.  Pulmonary:     Effort: Pulmonary effort is normal.  Musculoskeletal:     Cervical back: Neck supple.  Skin:    General: Skin is warm.  Neurological:     Mental Status: She is alert.     Deep Tendon Reflexes: Reflexes are normal and symmetric.     Wt Readings from Last 3 Encounters:  06/12/22 129 lb (58.5 kg)  05/24/22 125 lb (56.7 kg)  05/23/21 126 lb (57.2 kg)    BP (!) 110/52   Pulse (!) 52   Ht _0  (1.702 m)   Wt 129 lb (58.5 kg)   SpO2 99%   BMI 20.20 kg/m   Assessment and Plan  1. Acute bacterial conjunctivitis of right eye Acute.  Persistent.  Stable.  Mild erythema of the right conjunctiva.  Bed elevated with no evidence of foreign body.  We will treat with Ciloxan ophthalmic drops 1 drop every 4 hours while awake.  Patient is also been instructed to take an over-the-counter nonsedating antihistamine such as Claritin or Allegra in case this is also an allergic conjunctivitis.  Otilio Miu, MD

## 2022-06-20 NOTE — Progress Notes (Unsigned)
I connected with  Margaret Kennedy on 06/21/2022 by a audio enabled telemedicine application and verified that I am speaking with the correct person using two identifiers.  Patient Location: Home  Provider Location: Office/Clinic  I discussed the limitations of evaluation and management by telemedicine. The patient expressed understanding and agreed to proceed.  Subjective:   Margaret Kennedy is a 66 y.o. female who presents for Medicare Annual (Subsequent) preventive examination.  Review of Systems    Per HPI unless specifically indicated below.        Objective:    There were no vitals filed for this visit. There is no height or weight on file to calculate BMI.     02/17/2015    8:37 AM  Advanced Directives  Does Patient Have a Medical Advance Directive? Yes  Copy of [place] in Chart? No - copy requested    Current Medications (verified) Outpatient Encounter Medications as of 06/21/2022  Medication Sig   apixaban (ELIQUIS) 5 MG TABS tablet Take 1 tablet by mouth 2 (two) times daily.   ciprofloxacin (CILOXAN) 0.3 % ophthalmic solution Place 1 drop into the right eye every 4 (four) hours while awake. Administer 1 drop, every 2 hours, while awake, for 2 days. Then 1 drop, every 4 hours, while awake, for the next 5 days.   clotrimazole-betamethasone (LOTRISONE) cream Apply 1 Application topically 2 (two) times daily.   meloxicam (MOBIC) 7.5 MG tablet Take 1 tablet (7.5 mg total) by mouth daily.   rosuvastatin (CRESTOR) 5 MG tablet Take 1 tablet (5 mg total) by mouth daily.   valACYclovir (VALTREX) 500 MG tablet Take 1 tablet (500 mg total) by mouth 2 (two) times daily. As needed   No facility-administered encounter medications on file as of 06/21/2022.    Allergies (verified) Patient has no known allergies.   History: Past Medical History:  Diagnosis Date   Cancer ([place])    skin   History of cold sores    Past Surgical History:  Procedure Laterality  Date   BREAST BIOPSY Left 12/03/12   u/s bx/clip-neg   COLONOSCOPY  2014   cleared for 5 years   VAGINAL HYSTERECTOMY     Family History  Problem Relation Age of Onset   Cancer Mother    Cancer Father    Breast cancer Daughter 67   Cancer Daughter    Social History   Socioeconomic History   Marital status: Married    Spouse name: Not on file   Number of children: Not on file   Years of education: Not on file   Highest education level: Not on file  Occupational History   Not on file  Tobacco Use   Smoking status: Never   Smokeless tobacco: Never  Substance and Sexual Activity   Alcohol use: Yes    Comment: weekend   Drug use: No   Sexual activity: Yes  Other Topics Concern   Not on file  Social History Narrative   Not on file   Social Determinants of Health   Financial Resource Strain: Not on file  Food Insecurity: Not on file  Transportation Needs: Not on file  Physical Activity: Not on file  Stress: Not on file  Social Connections: Not on file    Tobacco Counseling Counseling given: Not Answered   Clinical Intake:                 Diabetic?No  Activities of Daily Living     No data to display          Patient Care Team: Juline Patch, MD as PCP - General (Family Medicine)  Indicate any recent Medical Services you may have received from other than [facility] providers in the past year (date may be approximate).     Assessment:   This is a routine wellness examination for Margaret Kennedy.  Hearing/Vision screen No results found.  Dietary issues and exercise activities discussed:     Goals Addressed   None    Depression Screen    06/12/2022    8:27 AM 05/24/2022    8:46 AM 05/23/2021    8:42 AM 05/10/2020    8:32 AM 05/08/2019    8:18 AM 03/19/2018    8:38 AM 03/19/2017    8:41 AM  PHQ 2/9 Scores  PHQ - 2 Score 0 0 0 0 0 0 0  PHQ- 9 Score 0 0 6 0 0 0 0    Fall Risk    06/12/2022    8:27 AM 05/24/2022    9:01 AM  05/24/2022    8:45 AM 05/23/2021    8:43 AM 05/10/2020    8:31 AM  Fall Risk   Falls in the past year? 0 0 0 0 0  Number falls in past yr: 0 0 0 0   Injury with Fall? 0 0 0 0   Risk for fall due to : No Fall Risks No Fall Risks No Fall Risks No Fall Risks   Follow up Falls evaluation completed Falls evaluation completed Falls evaluation completed Follow up appointment Falls evaluation completed    [place]:  Any stairs in or around the home? {YES/NO:21197} If so, are there any without handrails? {YES/NO:21197} Home free of loose throw rugs in walkways, pet beds, electrical cords, etc? {YES/NO:21197} Adequate lighting in your home to reduce risk of falls? {YES/NO:21197}  ASSISTIVE DEVICES UTILIZED TO PREVENT FALLS:  Life alert? {YES/NO:21197} Use of a cane, walker or w/c? {YES/NO:21197} Grab bars in the bathroom? {YES/NO:21197} Shower chair or bench in shower? {YES/NO:21197} Elevated toilet seat or a handicapped toilet? {YES/NO:21197}  TIMED UP AND GO:  Was the test performed? {YES/NO:21197}.  Length of time to ambulate 10 feet: *** sec.   {Appearance of STMH:9622297}  Cognitive Function:        Immunizations Immunization History  Administered Date(s) Administered   Fluad Quad(high Dose 65+) 05/24/2022   Influenza Inj Mdck Quad Pf 04/10/2019   Influenza, Quadrivalent, Recombinant, Inj, Pf 08/17/2018   Influenza,inj,Quad PF,6+ Mos 05/22/2017, 05/23/2021   Influenza-Unspecified 08/17/2018, 04/10/2019, 05/04/2020   MMR 05/30/2018, 06/06/2018, 07/02/2018, 07/03/2018   PFIZER(Purple Top)SARS-COV-2 Vaccination 10/28/2019, 11/18/2019, 05/27/2020   PNEUMOCOCCAL CONJUGATE-20 01/12/2022   PPD Test 10/08/2017, 10/08/2017   [place] Bivalent Pediatric Vaccine(19mo to <537yr 01/12/2022   Td 01/12/2022   Tdap 07/11/2015   Zoster Recombinat (Shingrix) 03/14/2017, 05/22/2017, 05/22/2017   Zoster, Live 09/14/2010    TDAP status: Up to  date  Flu Vaccine status: Up to date  Pneumococcal vaccine status: Up to date  Covid-19 vaccine status: Information provided on how to obtain vaccines.   Qualifies for Shingles Vaccine? Yes   Zostavax completed Yes   Shingrix Completed?: Yes  Screening Tests Health Maintenance  Topic Date Due   COVID-19 Vaccine (5 - Pfizer series) 06/28/2022 (Originally 05/14/2022)   Medicare Annual Wellness (AWV)  07/13/2022 (Originally [birth year]  PAP SMEAR-Modifier  03/20/2023  MAMMOGRAM  08/04/2023   COLONOSCOPY (Pts 45-31yr Insurance coverage will need to be confirmed)  03/27/2026   TETANUS/TDAP  01/13/2032   Pneumonia Vaccine 65 Years old  Completed   INFLUENZA VACCINE  Completed   DEXA SCAN  Completed   Hepatitis C Screening  Completed   HIV Screening  Completed   Zoster Vaccines- Shingrix  Completed   HPV VACCINES  Aged Out    Health Maintenance  There are no preventive care reminders to display for this patient.  Colorectal cancer screening: Type of screening: Colonoscopy. Completed 03/27/2016. Repeat every 10 years  Mammogram status: Completed 08/03/2021. Repeat every year  DEXA Scan: 04/04/2018   Lung Cancer Screening: (Low Dose CT Chest recommended if Age 65-80years, 30 pack-year currently smoking OR have quit w/in 15years.) does not qualify.   Lung Cancer Screening Referral: ***  Additional Screening:  Hepatitis C Screening: does qualify; Completed 03/21/2017  Vision Screening: Recommended annual ophthalmology exams for early detection of glaucoma and other disorders of the eye. Is the patient up to date with their annual eye exam?  {YES/NO:21197} Who is the provider or what is the name of the office in which the patient attends annual eye exams? *** If pt is not established with a provider, would they like to be referred to a provider to establish care? {YES/NO:21197}.   Dental Screening: Recommended annual dental exams for proper oral hygiene  Community  Resource Referral / Chronic Care Management: CRR required this visit?  {YES/NO:21197}  CCM required this visit?  {YES/NO:21197}     Plan:     I have personally reviewed and noted the following in the patient's chart:   Medical and social history Use of alcohol, tobacco or illicit drugs  Current medications and supplements including opioid prescriptions. {Opioid [phone]} Functional ability and status Nutritional status Physical activity Advanced directives List of other physicians Hospitalizations, surgeries, and ER visits in previous 12 months Vitals Screenings to include cognitive, depression, and falls Referrals and appointments  In addition, I have reviewed and discussed with patient certain preventive protocols, quality metrics, and best practice recommendations. A written personalized care plan for preventive services as well as general preventive health recommendations were provided to patient.    Ms. CSchwandt, Thank you for taking time to come for your Medicare Wellness Visit. I appreciate your ongoing commitment to your health goals. Please review the following plan we discussed and let me know if I can assist you in the future.   These are the goals we discussed:  Goals   None     This is a list of the screening recommended for you and due dates:  Health Maintenance  Topic Date Due   COVID-19 Vaccine (5 - Pfizer series) 06/28/2022*   Pap Smear  03/20/2023   Medicare Annual Wellness Visit  06/22/2023   Mammogram  08/04/2023   Colon Cancer Screening  03/27/2026   Tetanus Vaccine  01/13/2032   Pneumonia Vaccine  Completed   Flu Shot  Completed   DEXA scan (bone density measurement)  Completed   Hepatitis C Screening: USPSTF Recommendation to screen - Ages 129-79yo.  Completed   HIV Screening  Completed   Zoster (Shingles) Vaccine  Completed   HPV Vaccine  Aged Out  *Topic was postponed. The date shown is not the original due date.      KWilson Singer CPort Jefferson  06/20/2022   Nurse Notes: Approximately 30 minute Non-Face-To-Face Visit

## 2022-06-20 NOTE — Patient Instructions (Signed)

## 2022-06-21 ENCOUNTER — Ambulatory Visit (INDEPENDENT_AMBULATORY_CARE_PROVIDER_SITE_OTHER): Payer: Medicare PPO

## 2022-06-21 DIAGNOSIS — Z0000 Encounter for general adult medical examination without abnormal findings: Secondary | ICD-10-CM | POA: Diagnosis not present

## 2022-08-03 ENCOUNTER — Other Ambulatory Visit: Payer: Self-pay | Admitting: Family Medicine

## 2022-08-03 DIAGNOSIS — E782 Mixed hyperlipidemia: Secondary | ICD-10-CM

## 2022-08-04 NOTE — Telephone Encounter (Signed)
Requested Prescriptions  Pending Prescriptions Disp Refills   rosuvastatin (CRESTOR) 5 MG tablet [Pharmacy Med Name: Rosuvastatin Calcium 5 MG Oral Tablet] 90 tablet 0    Sig: Take 1 tablet by mouth once daily     Cardiovascular:  Antilipid - Statins 2 Failed - 08/03/2022  5:49 PM      Failed - Lipid Panel in normal range within the last 12 months    Cholesterol, Total  Date Value Ref Range Status  05/24/2022 241 (H) 100 - 199 mg/dL Final   LDL Chol Calc (NIH)  Date Value Ref Range Status  05/24/2022 171 (H) 0 - 99 mg/dL Final   HDL  Date Value Ref Range Status  05/24/2022 56 >39 mg/dL Final   Triglycerides  Date Value Ref Range Status  05/24/2022 79 0 - 149 mg/dL Final         Passed - Cr in normal range and within 360 days    Creatinine, Ser  Date Value Ref Range Status  05/24/2022 0.76 0.57 - 1.00 mg/dL Final         Passed - Patient is not pregnant      Passed - Valid encounter within last 12 months    Recent Outpatient Visits           1 month ago Acute bacterial conjunctivitis of right eye   [place] [facility] [place], Deanna C, MD   2 months ago Annual physical exam   [place] [facility] [place], Deanna C, MD   1 year ago Annual physical exam    [facility] at [place], Deanna C, MD   2 years ago Annual physical exam   [facility] at [place], Deanna C, MD   3 years ago Annual physical exam   [facility], [facility]

## 2022-08-08 ENCOUNTER — Other Ambulatory Visit: Payer: Self-pay | Admitting: Family Medicine

## 2022-08-08 DIAGNOSIS — E782 Mixed hyperlipidemia: Secondary | ICD-10-CM

## 2022-08-22 ENCOUNTER — Ambulatory Visit
Admission: RE | Admit: 2022-08-22 | Discharge: 2022-08-22 | Disposition: A | Discharge status: Home / Self Care | Payer: Medicare PPO | Source: Ambulatory Visit | Attending: Family Medicine | Admitting: Family Medicine

## 2022-08-22 DIAGNOSIS — Z1231 Encounter for screening mammogram for malignant neoplasm of breast: Secondary | ICD-10-CM | POA: Insufficient documentation

## 2022-10-03 DIAGNOSIS — I480 Paroxysmal atrial fibrillation: Secondary | ICD-10-CM | POA: Diagnosis not present

## 2022-10-03 DIAGNOSIS — G459 Transient cerebral ischemic attack, unspecified: Secondary | ICD-10-CM | POA: Diagnosis not present

## 2022-10-09 DIAGNOSIS — Z808 Family history of malignant neoplasm of other organs or systems: Secondary | ICD-10-CM | POA: Diagnosis not present

## 2022-10-09 DIAGNOSIS — D2271 Melanocytic nevi of right lower limb, including hip: Secondary | ICD-10-CM | POA: Diagnosis not present

## 2022-10-09 DIAGNOSIS — X32XXXA Exposure to sunlight, initial encounter: Secondary | ICD-10-CM | POA: Diagnosis not present

## 2022-10-09 DIAGNOSIS — L570 Actinic keratosis: Secondary | ICD-10-CM | POA: Diagnosis not present

## 2022-10-09 DIAGNOSIS — D2261 Melanocytic nevi of right upper limb, including shoulder: Secondary | ICD-10-CM | POA: Diagnosis not present

## 2022-10-09 DIAGNOSIS — D225 Melanocytic nevi of trunk: Secondary | ICD-10-CM | POA: Diagnosis not present

## 2022-10-09 DIAGNOSIS — D2272 Melanocytic nevi of left lower limb, including hip: Secondary | ICD-10-CM | POA: Diagnosis not present

## 2022-10-09 DIAGNOSIS — L821 Other seborrheic keratosis: Secondary | ICD-10-CM | POA: Diagnosis not present

## 2022-10-09 DIAGNOSIS — Z7189 Other specified counseling: Secondary | ICD-10-CM | POA: Diagnosis not present

## 2022-12-07 ENCOUNTER — Other Ambulatory Visit: Payer: Self-pay | Admitting: Family Medicine

## 2022-12-07 DIAGNOSIS — B356 Tinea cruris: Secondary | ICD-10-CM

## 2022-12-07 NOTE — Telephone Encounter (Signed)
Requested medication (s) are due for refill today - yes  Requested medication (s) are on the active medication list -yes  Future visit scheduled -no  Last refill: 05/25/22 30g  Notes to clinic: off protocol- provider review   Requested Prescriptions  Pending Prescriptions Disp Refills   clotrimazole-betamethasone (LOTRISONE) cream [Pharmacy Med Name: Clotrimazole-Betamethasone 1-0.05 % External Cream] 30 g 0    Sig: APPLY  CREAM TOPICALLY TWICE DAILY     Off-Protocol Failed - 12/07/2022  9:31 AM      Failed - Medication not assigned to a protocol, review manually.      Passed - Valid encounter within last 12 months    Recent Outpatient Visits           5 months ago Acute bacterial conjunctivitis of right eye   [place] [facility], [facility]   6 months ago Annual physical exam   [facility], [facility]   1 year ago Annual physical exam   [facility], [facility]   2 years ago Annual physical exam   [facility], MD   3 years ago Annual physical exam   [facility], MD                 Requested Prescriptions  Pending Prescriptions Disp Refills   clotrimazole-betamethasone (LOTRISONE) cream [Pharmacy Med Name: Clotrimazole-Betamethasone 1-0.05 % External Cream] 30 g 0    Sig: APPLY  CREAM TOPICALLY TWICE DAILY     Off-Protocol Failed - 12/07/2022  9:31 AM      Failed - Medication not assigned to a protocol, review manually.      Passed - Valid encounter within last 12 months    Recent Outpatient Visits           5 months ago Acute bacterial conjunctivitis of right eye   [place] [facility], MD   6 months ago  Annual physical exam   [facility], MD   1 year ago Annual physical exam   [facility], MD   2 years ago Annual physical exam   [facility], MD   3 years ago Annual physical exam   [facility], MD

## 2022-12-18 ENCOUNTER — Encounter: Payer: Self-pay | Admitting: Family Medicine

## 2022-12-20 ENCOUNTER — Other Ambulatory Visit: Payer: Self-pay

## 2022-12-20 DIAGNOSIS — Z1211 Encounter for screening for malignant neoplasm of colon: Secondary | ICD-10-CM

## 2022-12-20 NOTE — Progress Notes (Signed)
Sent in ref to GI

## 2022-12-26 ENCOUNTER — Telehealth: Payer: Self-pay | Admitting: *Deleted

## 2022-12-26 ENCOUNTER — Other Ambulatory Visit: Payer: Self-pay | Admitting: *Deleted

## 2022-12-26 DIAGNOSIS — Z1211 Encounter for screening for malignant neoplasm of colon: Secondary | ICD-10-CM

## 2022-12-26 MED ORDER — PEG 3350-KCL-NABCB-NACL-NASULF 236 G PO SOLR: 4000.0000 mL | Freq: Once | ORAL | 0 refills | Status: AC

## 2022-12-26 NOTE — Telephone Encounter (Signed)
Gastroenterology Pre-Procedure Review  Request Date: 02/05/2023 Requesting Physician: Dr. Servando Snare  PATIENT REVIEW QUESTIONS: The patient responded to the following health history questions as indicated:    1. Are you having any GI issues? no 2. Do you have a personal history of Polyps? yes (03/27/2016, she believe she have a few) 3. Do you have a family history of Colon Cancer or Polyps? yes(mother had polyps) 4. Diabetes Mellitus? no 5. Joint replacements in the past 12 months?no 6. Major health problems in the past 3 months?no 7. Any artificial heart valves, MVP, or defibrillator?yes (a-fib)    MEDICATIONS & ALLERGIES:    Patient reports the following regarding taking any anticoagulation/antiplatelet therapy:   Plavix, Coumadin, Eliquis, Xarelto, Lovenox, Pradaxa, Brilinta, or Effient? yes (Eliquis) Aspirin? no  Patient confirms/reports the following medications:  Current Outpatient Medications  Medication Sig Dispense Refill   polyethylene glycol (GOLYTELY) 236 g solution Take 4,000 mLs by mouth once for 1 dose. 4000 mL 0   apixaban (ELIQUIS) 5 MG TABS tablet Take 1 tablet by mouth 2 (two) times daily.     ciprofloxacin (CILOXAN) 0.3 % ophthalmic solution Place 1 drop into the right eye every 4 (four) hours while awake. Administer 1 drop, every 2 hours, while awake, for 2 days. Then 1 drop, every 4 hours, while awake, for the next 5 days. 5 mL 0   clotrimazole-betamethasone (LOTRISONE) cream APPLY  CREAM TOPICALLY TWICE DAILY 30 g 0   meloxicam (MOBIC) 7.5 MG tablet Take 1 tablet (7.5 mg total) by mouth daily. 30 tablet 11   rosuvastatin (CRESTOR) 5 MG tablet Take 1 tablet by mouth once daily 30 tablet 0   valACYclovir (VALTREX) 500 MG tablet Take 1 tablet (500 mg total) by mouth 2 (two) times daily. As needed 30 tablet 2   No current facility-administered medications for this visit.    Patient confirms/reports the following allergies:  No Known Allergies  No orders of the defined  types were placed in this encounter.   AUTHORIZATION INFORMATION Primary Insurance: 1D#: Group #:  Secondary Insurance: 1D#: Group #:  SCHEDULE INFORMATION: Date: 02/05/2023 Time: Location: [facility]

## 2023-01-04 ENCOUNTER — Other Ambulatory Visit: Payer: Self-pay | Admitting: Family Medicine

## 2023-01-04 DIAGNOSIS — E782 Mixed hyperlipidemia: Secondary | ICD-10-CM

## 2023-01-04 NOTE — Telephone Encounter (Signed)
Requested medication (s) are due for refill today: yes  Requested medication (s) are on the active medication list: yes  Last refill:  08/08/22 #30  Future visit scheduled: no  Notes to clinic:  overdue for lab work- no lab order in place- does pt need a lab only visit or OV.    Requested Prescriptions  Pending Prescriptions Disp Refills   rosuvastatin (CRESTOR) 5 MG tablet [Pharmacy Med Name: Rosuvastatin Calcium 5 MG Oral Tablet] 30 tablet 0    Sig: TAKE 1 TABLET BY MOUTH ONCE DAILY. *NEEDS LABS*     Cardiovascular:  Antilipid - Statins 2 Failed - 01/04/2023  7:51 AM      Failed - Lipid Panel in normal range within the last 12 months    Cholesterol, Total  Date Value Ref Range Status  05/24/2022 241 (H) 100 - 199 mg/dL Final   LDL Chol Calc (NIH)  Date Value Ref Range Status  05/24/2022 171 (H) 0 - 99 mg/dL Final   HDL  Date Value Ref Range Status  05/24/2022 56 >39 mg/dL Final   Triglycerides  Date Value Ref Range Status  05/24/2022 79 0 - 149 mg/dL Final         Passed - Cr in normal range and within 360 days    Creatinine, Ser  Date Value Ref Range Status  05/24/2022 0.76 0.57 - 1.00 mg/dL Final         Passed - Patient is not pregnant      Passed - Valid encounter within last 12 months    Recent Outpatient Visits           6 months ago Acute bacterial conjunctivitis of right eye   [place] [facility]   7 months ago Annual physical exam   [facility], MD   1 year ago Annual physical exam   [facility], MD   2 years ago Annual physical exam   [facility], MD   3 years ago Annual physical exam   [facility], MD

## 2023-01-09 ENCOUNTER — Telehealth: Payer: Self-pay | Admitting: *Deleted

## 2023-01-09 NOTE — Telephone Encounter (Signed)
Patient called office, in need of rescheduling her colonoscopy,  02/05/2023.  Rescheduling to 02/22/2023. Contact Kim at [facility] to make to change.  New instructions will be sent. Prep solution have been pick up yet.  Patient verbalized understanding.

## 2023-01-09 NOTE — Telephone Encounter (Signed)
Received Procedure clearance on 12/28/2022 from [facility].  Patient is clear to have procedure and should stop taking Eliguis 5 mg 3 days prior and restart 1 day after.  This information was share with patient today since she have reschedule to 02/22/2023 at [facility].  Will remind patient closer to the procedure date.  Stop on 02/19/2023 and restart on 02/23/2023

## 2023-01-22 ENCOUNTER — Other Ambulatory Visit: Payer: Self-pay | Admitting: Family Medicine

## 2023-01-22 DIAGNOSIS — E782 Mixed hyperlipidemia: Secondary | ICD-10-CM

## 2023-01-23 NOTE — Telephone Encounter (Signed)
Requested medication (s) are due for refill today: yes  Requested medication (s) are on the active medication list: yes  Last refill:  01/04/23  Future visit scheduled: no  Notes to clinic:  Unable to refill per protocol due to failed labs, no lipid updated results.      Requested Prescriptions  Pending Prescriptions Disp Refills   rosuvastatin (CRESTOR) 5 MG tablet [Pharmacy Med Name: Rosuvastatin Calcium 5 MG Oral Tablet] 15 tablet 0    Sig: TAKE 1 TABLET BY MOUTH ONCE DAILY -  NEEDS  LABS     Cardiovascular:  Antilipid - Statins 2 Failed - 01/22/2023  7:01 AM      Failed - Lipid Panel in normal range within the last 12 months    Cholesterol, Total  Date Value Ref Range Status  05/24/2022 241 (H) 100 - 199 mg/dL Final   LDL Chol Calc (NIH)  Date Value Ref Range Status  05/24/2022 171 (H) 0 - 99 mg/dL Final   HDL  Date Value Ref Range Status  05/24/2022 56 >39 mg/dL Final   Triglycerides  Date Value Ref Range Status  05/24/2022 79 0 - 149 mg/dL Final         Passed - Cr in normal range and within 360 days    Creatinine, Ser  Date Value Ref Range Status  05/24/2022 0.76 0.57 - 1.00 mg/dL Final         Passed - Patient is not pregnant      Passed - Valid encounter within last 12 months    Recent Outpatient Visits           7 months ago Acute bacterial conjunctivitis of right eye   [facility]   8 months ago Annual physical exam   [facility]   1 year ago Annual physical exam   [facility]   2 years ago Annual physical exam   [facility]   3 years ago Annual physical exam   [facility]

## 2023-01-24 DIAGNOSIS — I480 Paroxysmal atrial fibrillation: Secondary | ICD-10-CM | POA: Diagnosis not present

## 2023-01-24 DIAGNOSIS — G459 Transient cerebral ischemic attack, unspecified: Secondary | ICD-10-CM | POA: Diagnosis not present

## 2023-02-14 NOTE — Telephone Encounter (Signed)
I have called patient this morning.  Patient called back. Notified patient of the information below.

## 2023-02-15 IMAGING — MG MM DIGITAL SCREENING BILAT W/ TOMO AND CAD
8 series · 9 of 24 positions shown · non-contrast
Comparison: Previous exam(s).

CLINICAL DATA: Screening.

EXAM:
DIGITAL SCREENING BILATERAL MAMMOGRAM WITH TOMOSYNTHESIS AND CAD
TECHNIQUE: Bilateral screening digital craniocaudal and mediolateral oblique
mammograms were obtained. Bilateral screening digital breast
tomosynthesis was performed. The images were evaluated with
computer-aided detection.

[L MLO synth-2D]
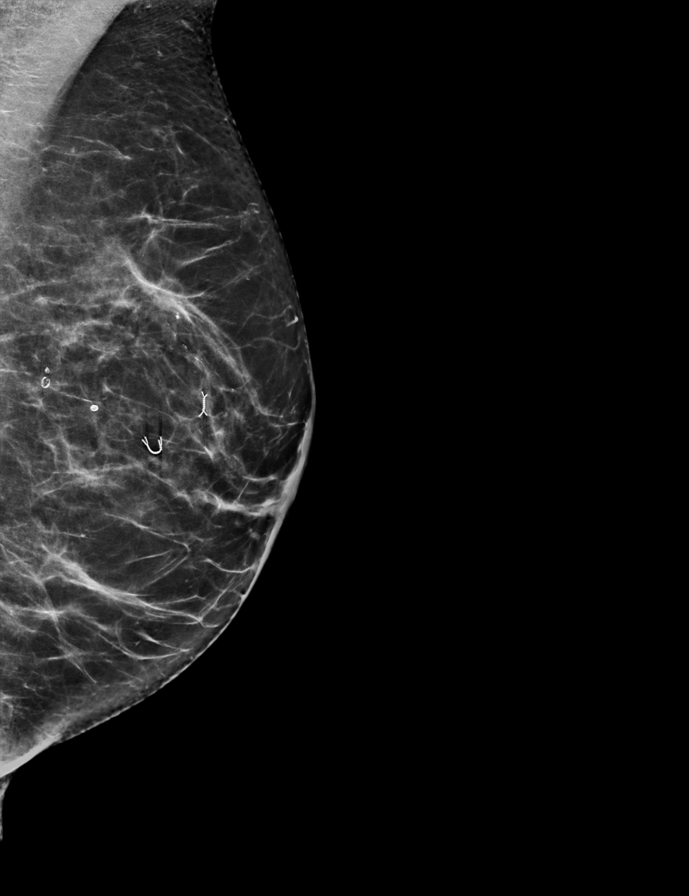

[L CC synth-2D]
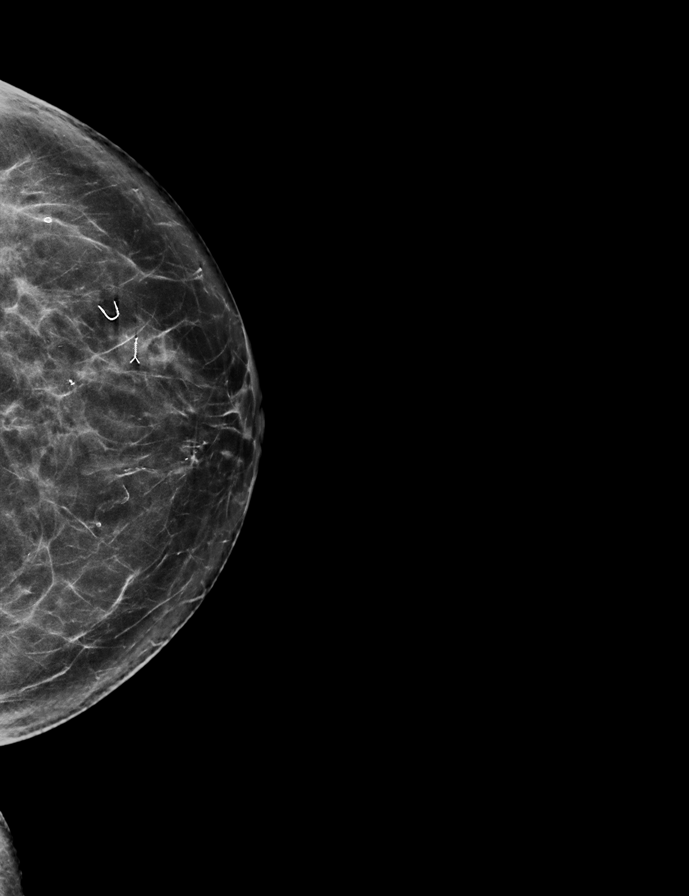

[R CC synth-2D]
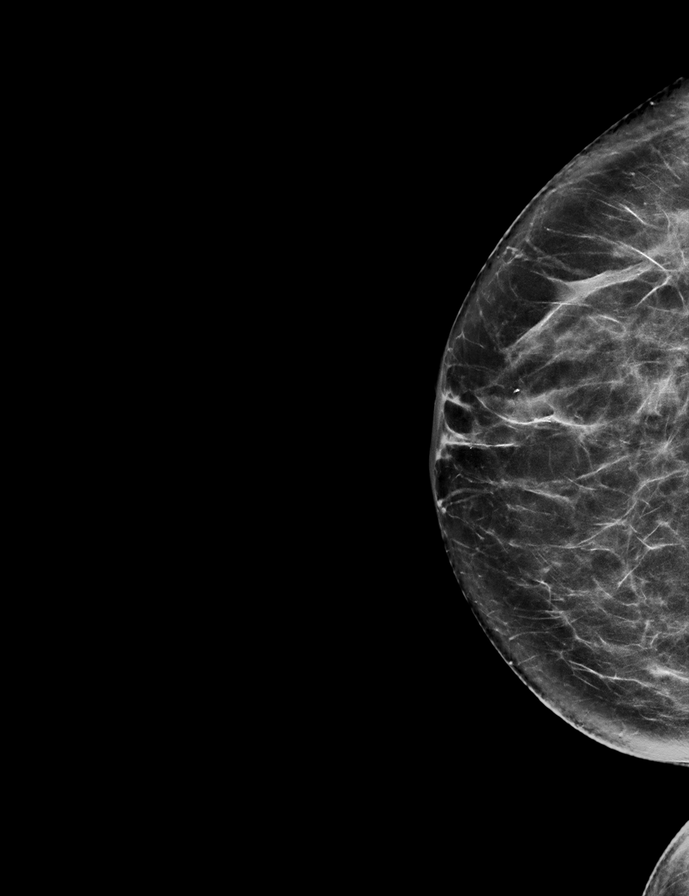

[R MLO synth-2D]
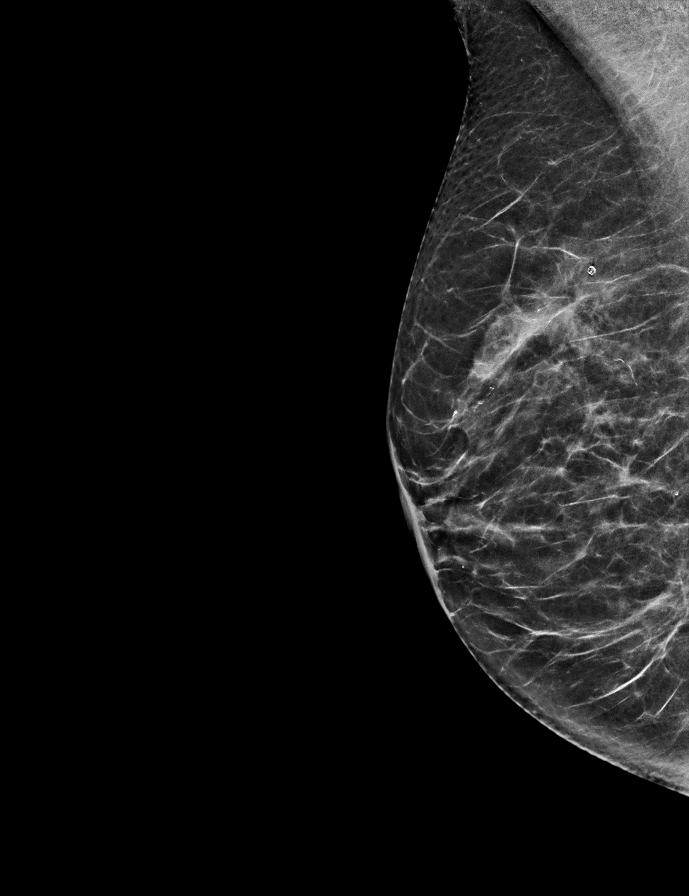

[R CC tomo · 2 of 72 frames shown]
[frame 24/72]
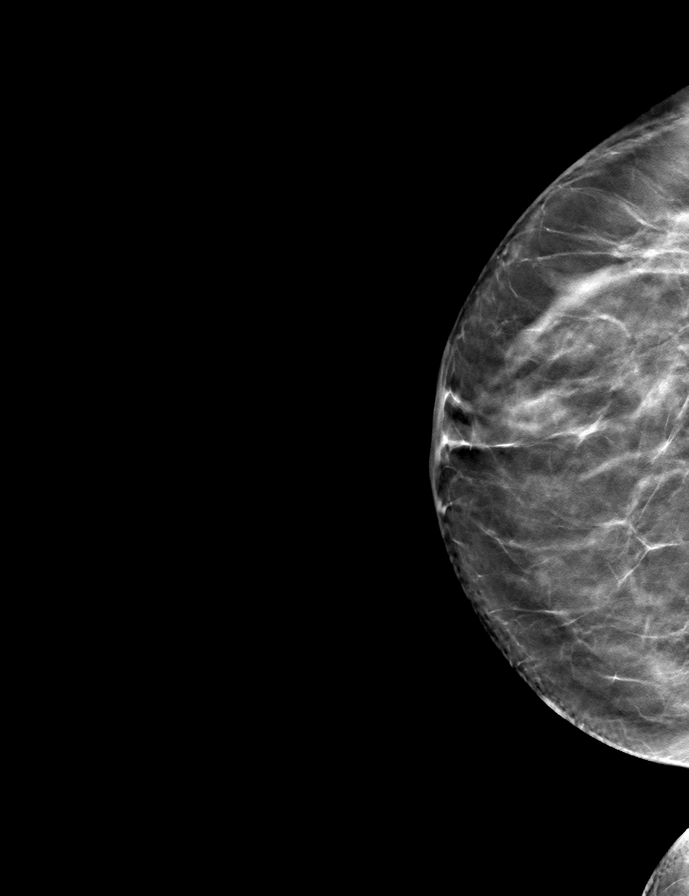
[frame 37/72]
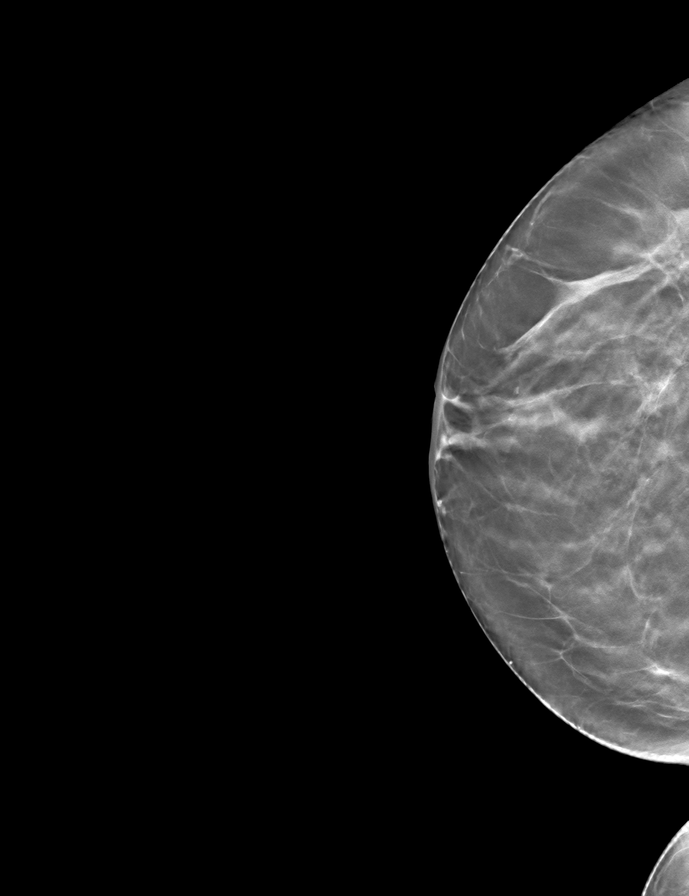

[L CC tomo · tomo slice 39/76.0]
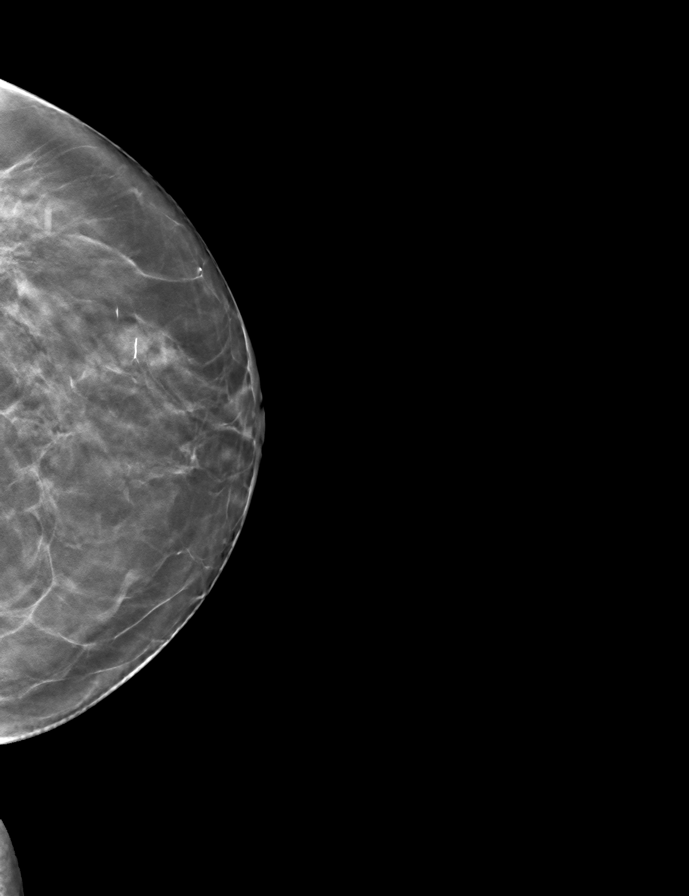

[R MLO tomo · tomo slice 33/64.0]
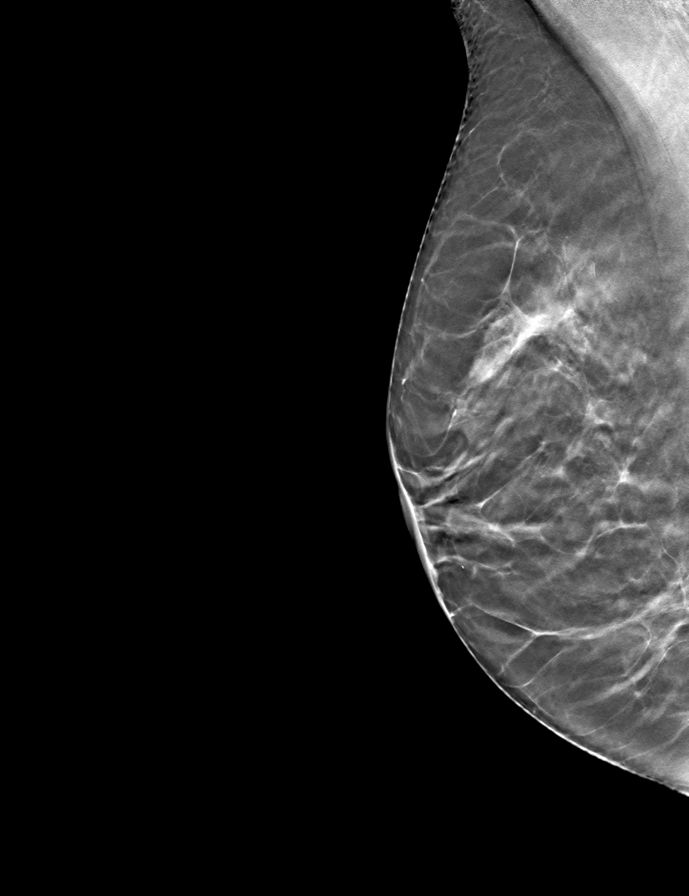

[L MLO tomo · tomo slice 35/69.0]
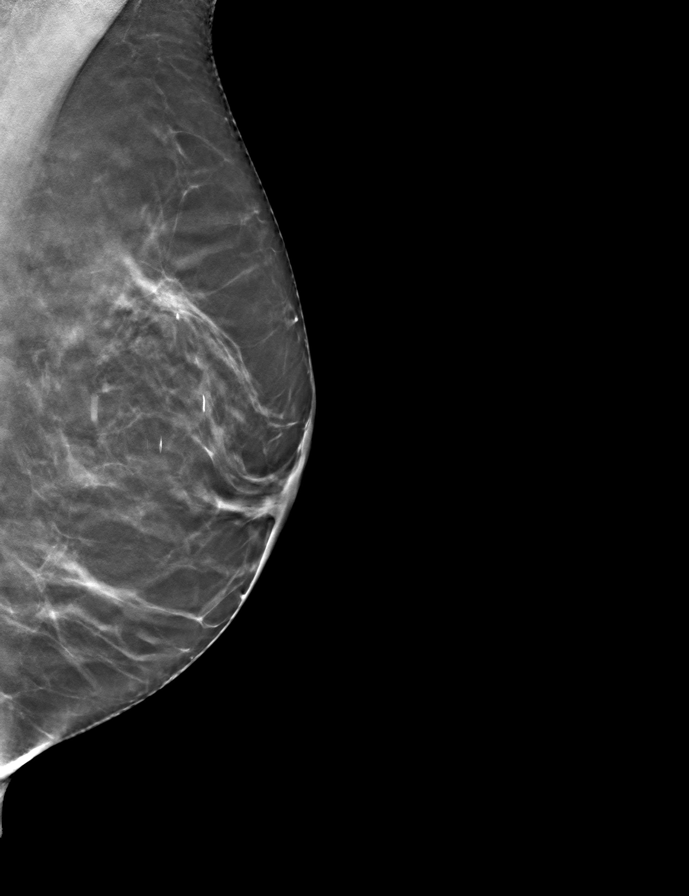

[9 of 24 positions shown; findings below may reference images not displayed]

ACR Breast Density Category b: There are scattered areas of
fibroglandular density.
FINDINGS: There are no findings suspicious for malignancy.
IMPRESSION: No mammographic evidence of malignancy. A result letter of this
screening mammogram will be mailed directly to the patient.

RECOMMENDATION:
Screening mammogram in one year. (Code:51-O-LD2)

BI-RADS CATEGORY  1: Negative.

## 2023-02-18 ENCOUNTER — Other Ambulatory Visit: Payer: Self-pay | Admitting: Family Medicine

## 2023-02-18 DIAGNOSIS — E782 Mixed hyperlipidemia: Secondary | ICD-10-CM

## 2023-02-19 ENCOUNTER — Encounter: Payer: Self-pay | Admitting: Gastroenterology

## 2023-02-21 NOTE — Anesthesia Preprocedure Evaluation (Signed)
Anesthesia Evaluation  Patient identified by MRN, date of birth, ID band Patient awake    Reviewed: Allergy & Precautions, H&P , NPO status , Patient's Chart, lab work & pertinent test results  Airway Mallampati: III  TM Distance: >3 FB Neck ROM: Full    Dental no notable dental hx.    Pulmonary neg pulmonary ROS   Pulmonary exam normal breath sounds clear to auscultation       Cardiovascular negative cardio ROS Normal cardiovascular exam Rhythm:Regular Rate:Normal  Date of Service: 01/24/2023 Date of Birth: [birth year] PCP: Farrel Demark, MD History of Present Illness: [name] is a 67 y.o.female patient who returns for paroxysmal atrial fibrillation. The patient reports several year history of episodic tachycardia. She received a Fitbit for Christmas 08/07/2021, and first detected atrial fibrillation on 08/19/2021. ECG 09/29/2021 revealed atrial fibrillation at a rate of 83 bpm. She reports she experienced a TIA when she was pregnant at age 89, and had a second TIA in her 50s. 72-hour Holter monitor revealed predominant sinus rhythm, with paroxysmal atrial fibrillation, AF burden 4.05%, longest episode 2 hours and 47 minutes. 2D echocardiogram 10/11/2021 revealed normal left ventricular function, with LVEF greater than 55%, with mild mitral and tricuspid regurgitation.  The patient was recently seen by her primary care provider at which time she reported frequent palpitations. 72-hour Holter monitor 06/07/2022 - 06/10/2022 revealed predominant sinus rhythm, mean heart rate 69 bpm, sinus heart rate range 48 to 125 bpm, intermittent atrial fibrillation with AF burden 23%, with longest episode 13 hours and 11 minutes, with infrequent PACs and PVCs. The patient was started on metoprolol succinate 25 mg daily however the patient was intolerant with excessive fatigue.  The patient returns today, reports "doing well". She denies exertional  chest pain or shortness of breath. She reports 2-3 episodes, of probable atrial fibrillation, per week, which can last up to 8-12 hours. She denies peripheral edema. The patient remains active, exercises regularly, plays golf without difficulty.  The patient has paroxysmal atrial fibrillation, CHA2DS2-VASc score 3, started on Eliquis 09/29/2021 for stroke prevention, which is tolerated well without bleeding side effects.  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  Arthritis    Reproductive/Obstetrics negative OB ROS                              Anesthesia Physical Anesthesia Plan  ASA: 3  Anesthesia Plan: General   Post-op Pain Management:    Induction: Intravenous  PONV Risk Score and Plan:   Airway Management Planned: Natural Airway and Nasal Cannula  Additional Equipment:   Intra-op Plan:   Post-operative Plan:   Informed Consent: I have reviewed the patients History and Physical, chart, labs and discussed the procedure including the risks, benefits and alternatives for the proposed anesthesia with the patient or authorized representative who has indicated his/her understanding and acceptance.     Dental Advisory Given  Plan Discussed with: Anesthesiologist, CRNA and Surgeon  Anesthesia Plan Comments: (Patient consented for risks of anesthesia including but not limited to:  - adverse reactions to medications - risk of airway placement if  required - damage to eyes, teeth, lips or other oral mucosa - nerve damage due to positioning  - sore throat or hoarseness - Damage to heart, brain, nerves, lungs, other parts of body or loss of life  Patient voiced understanding.)         Anesthesia Quick Evaluation

## 2023-02-22 ENCOUNTER — Encounter
Admission: RE | Disposition: A | Discharge status: Home / Self Care | Payer: Self-pay | Source: Home / Self Care | Attending: Gastroenterology

## 2023-02-22 ENCOUNTER — Ambulatory Visit
Admission: RE | Admit: 2023-02-22 | Discharge: 2023-02-22 | Disposition: A | Discharge status: Home / Self Care | Payer: Medicare PPO | Attending: Gastroenterology | Admitting: Gastroenterology

## 2023-02-22 ENCOUNTER — Ambulatory Visit: Payer: Medicare PPO | Admitting: Anesthesiology

## 2023-02-22 ENCOUNTER — Other Ambulatory Visit: Payer: Self-pay

## 2023-02-22 ENCOUNTER — Encounter: Payer: Self-pay | Admitting: Gastroenterology

## 2023-02-22 DIAGNOSIS — K640 First degree hemorrhoids: Secondary | ICD-10-CM | POA: Insufficient documentation

## 2023-02-22 DIAGNOSIS — K635 Polyp of colon: Secondary | ICD-10-CM | POA: Diagnosis not present

## 2023-02-22 DIAGNOSIS — Z860100 Personal history of colon polyps, unspecified: Secondary | ICD-10-CM

## 2023-02-22 DIAGNOSIS — Z8673 Personal history of transient ischemic attack (TIA), and cerebral infarction without residual deficits: Secondary | ICD-10-CM | POA: Insufficient documentation

## 2023-02-22 DIAGNOSIS — M1990 Unspecified osteoarthritis, unspecified site: Secondary | ICD-10-CM | POA: Insufficient documentation

## 2023-02-22 DIAGNOSIS — Z7901 Long term (current) use of anticoagulants: Secondary | ICD-10-CM | POA: Insufficient documentation

## 2023-02-22 DIAGNOSIS — Z1211 Encounter for screening for malignant neoplasm of colon: Secondary | ICD-10-CM | POA: Diagnosis not present

## 2023-02-22 DIAGNOSIS — D120 Benign neoplasm of cecum: Secondary | ICD-10-CM | POA: Diagnosis not present

## 2023-02-22 DIAGNOSIS — I480 Paroxysmal atrial fibrillation: Secondary | ICD-10-CM | POA: Diagnosis not present

## 2023-02-22 HISTORY — DX: Presence of external hearing-aid: Z97.4

## 2023-02-22 HISTORY — DX: Transient cerebral ischemic attack, unspecified: G45.9

## 2023-02-22 HISTORY — DX: Unspecified osteoarthritis, unspecified site: M19.90

## 2023-02-22 HISTORY — PX: POLYPECTOMY: SHX5525

## 2023-02-22 HISTORY — DX: Unspecified atrial fibrillation: I48.91

## 2023-02-22 HISTORY — PX: COLONOSCOPY WITH PROPOFOL: SHX5780

## 2023-02-22 LAB — HM COLONOSCOPY

## 2023-02-22 SURGERY — COLONOSCOPY WITH PROPOFOL
Anesthesia: General

## 2023-02-22 MED ORDER — SODIUM CHLORIDE 0.9 % IV SOLN: INTRAVENOUS | Status: DC

## 2023-02-22 MED ORDER — PROPOFOL 10 MG/ML IV BOLUS
INTRAVENOUS | Status: DC | PRN
  Administered 2023-02-22: 30 mg via INTRAVENOUS
  Administered 2023-02-22: 90 mg via INTRAVENOUS
  Administered 2023-02-22: 30 mg via INTRAVENOUS

## 2023-02-22 MED ORDER — STERILE WATER FOR IRRIGATION IR SOLN
Status: DC | PRN
  Administered 2023-02-22: 240 mL

## 2023-02-22 MED ORDER — LIDOCAINE HCL (CARDIAC) PF 100 MG/5ML IV SOSY
PREFILLED_SYRINGE | INTRAVENOUS | Status: DC | PRN
  Administered 2023-02-22: 80 mg via INTRAVENOUS

## 2023-02-22 MED ORDER — STERILE WATER FOR IRRIGATION IR SOLN
Status: DC | PRN
  Administered 2023-02-22: 100 mL

## 2023-02-22 MED ORDER — LACTATED RINGERS IV SOLN: INTRAVENOUS | Status: DC

## 2023-02-22 MED ORDER — PROPOFOL 500 MG/50ML IV EMUL
INTRAVENOUS | Status: DC | PRN
  Administered 2023-02-22 (×2): 140 ug/kg/min via INTRAVENOUS

## 2023-02-22 SURGICAL SUPPLY — 7 items
FORCEPS BIOP RAD 4 LRG CAP 4 (CUTTING FORCEPS) IMPLANT
GOWN CVR UNV OPN BCK APRN NK (MISCELLANEOUS) ×4 IMPLANT
GOWN ISOL THUMB LOOP REG UNIV (MISCELLANEOUS) ×4
KIT PRC NS LF DISP ENDO (KITS) ×2 IMPLANT
KIT PROCEDURE OLYMPUS (KITS) ×2
MANIFOLD NEPTUNE II (INSTRUMENTS) ×2 IMPLANT
WATER STERILE IRR 250ML POUR (IV SOLUTION) ×2 IMPLANT

## 2023-02-22 NOTE — Anesthesia Postprocedure Evaluation (Signed)
Anesthesia Post Note  Patient: Margaret Kennedy  Procedure(s) Performed: COLONOSCOPY WITH PROPOFOL POLYPECTOMY  Patient location during evaluation: PACU Anesthesia Type: General Level of consciousness: awake and alert Pain management: pain level controlled Vital Signs Assessment: post-procedure vital signs reviewed and stable Respiratory status: spontaneous breathing, nonlabored ventilation, respiratory function stable and patient connected to nasal cannula oxygen Cardiovascular status: blood pressure returned to baseline and stable Postop Assessment: no apparent nausea or vomiting Anesthetic complications: no   No notable events documented.   Last Vitals:  Vitals:   02/22/23 0916 02/22/23 0917  BP:  120/65  Pulse:  72  Resp:  17  Temp: (!) 36.4 C (!) 36.4 C  SpO2:  98%    Last Pain:  Vitals:   02/22/23 0917  TempSrc:   PainSc: 0-No pain                 Marisue Humble

## 2023-02-22 NOTE — Transfer of Care (Signed)
Immediate Anesthesia Transfer of Care Note  Patient: Margaret Kennedy  Procedure(s) Performed: COLONOSCOPY WITH PROPOFOL POLYPECTOMY  Patient Location: PACU  Anesthesia Type:General  Level of Consciousness: sedated  Airway & Oxygen Therapy: Patient Spontanous Breathing  Post-op Assessment: Report given to RN and Post -op Vital signs reviewed and stable  Post vital signs: Reviewed and stable  Last Vitals: See flow sheet for temp Vitals Value Taken Time  BP 101/60 02/22/23 0908  Temp    Pulse 63 02/22/23 0909  Resp 15 02/22/23 0909  SpO2 98 % 02/22/23 0909  Vitals shown include unfiled device data.  Last Pain:  Vitals:   02/22/23 0716  TempSrc: Temporal  PainSc: 0-No pain         Complications: No notable events documented.

## 2023-02-22 NOTE — H&P (Signed)
Midge Minium, MD [facility] [address], [address] [place], [place] [address] Phone:[phone] Fax : [phone]  Primary Care Physician:  Duanne Limerick, MD Primary Gastroenterologist:  Dr. Servando Snare  Pre-Procedure History & Physical: HPI:  Margaret Kennedy is a 67 y.o. female is here for an colonoscopy.   Past Medical History:  Diagnosis Date   Arthritis    Atrial fibrillation (HCC)    Cancer (HCC)    skin   History of cold sores    TIA (transient ischemic attack)    over 20 yrs ago.  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 Authorizing Provider  apixaban (ELIQUIS) 5 MG TABS tablet Take 1 tablet by mouth 2 (two) times daily. 09/29/21  Yes [provider]  clotrimazole-betamethasone (LOTRISONE) cream APPLY  CREAM TOPICALLY TWICE DAILY 12/07/22  Yes Duanne Limerick, MD  meloxicam (MOBIC) 7.5 MG tablet Take 1 tablet (7.5 mg total) by mouth daily. 05/23/21  Yes Duanne Limerick, MD  rosuvastatin (CRESTOR) 5 MG tablet TAKE 1 TABLET BY MOUTH ONCE DAILY *CALL AND SCHEDULE APPOINTMENT WITH FASTING LABS* 02/19/23  Yes Duanne Limerick, MD  valACYclovir (VALTREX) 500 MG tablet Take 1 tablet (500 mg total) by mouth 2 (two) times daily. As needed 05/23/21  Yes Duanne Limerick, MD    Allergies as of 12/26/2022   (No Known Allergies)    Family History  Problem Relation Age of Onset   Cancer Mother    Cancer Father    Breast cancer Daughter 79   Cancer Daughter     Social History   Socioeconomic History   Marital status: Married    Spouse name: Channah Godeaux   Number of children: 2   Years of education: Not on file   Highest education level: Not on file  Occupational History   Occupation: Retired  Tobacco Use   Smoking  status: Never   Smokeless tobacco: Never  Vaping Use   Vaping status: Never Used  Substance and Sexual Activity   Alcohol use: Yes    Alcohol/week: 7.0 standard drinks of alcohol    Types: 7 Standard drinks or equivalent per week    Comment: weekend   Drug use: No   Sexual activity: Yes  Other Topics Concern   Not on file  Social History Narrative   Not on file   Social Determinants of Health   Financial Resource Strain: Low Risk  (06/21/2022)   Overall Financial Resource Strain (CARDIA)    Difficulty of Paying Living Expenses: Not hard at all  Food Insecurity: No Food Insecurity (06/21/2022)   Hunger Vital Sign    Worried About Running Out of Food in the Last Year: Never true    Ran Out of Food in the Last Year: Never true  Transportation Needs: No Transportation Needs (06/21/2022)   PRAPARE - Administrator, Civil Service (Medical): No    Lack of Transportation (Non-Medical): No  Physical Activity: Sufficiently Active (06/21/2022)   Exercise Vital Sign    Days of Exercise per Week: 5 days    Minutes of Exercise per Session: 40 min  Stress: No Stress Concern Present (06/21/2022)  [employer] of Occupational Health - Occupational Stress Questionnaire    Feeling of Stress : Not at all  Social Connections: Moderately Integrated (06/21/2022)   Social Connection and Isolation Panel [NHANES]    Frequency of Communication with Friends and Family: More than three times a week    Frequency of Social Gatherings with Friends and Family: Twice a week    Attends Religious Services: Never    Database administrator or Organizations: Yes    Attends Engineer, structural: More than 4 times per year    Marital Status: Married  Catering manager Violence: Not on file    Review of Systems: See HPI, otherwise negative ROS  Physical Exam: BP 125/63   Pulse (!) 58   Temp 97.8 F (36.6 C) (Temporal)   Resp 18   Ht 5\' 7"  (1.702 m)   Wt 59 kg   BMI 20.36 kg/m   General:   Alert,  pleasant and cooperative in NAD Head:  Normocephalic and atraumatic. Neck:  Supple; no masses or thyromegaly. Lungs:  Clear throughout to auscultation.    Heart:  Regular rate and rhythm. Abdomen:  Soft, nontender and nondistended. Normal bowel sounds, without guarding, and without rebound.   Neurologic:  Alert and  oriented x4;  grossly normal neurologically.  Impression/Plan: Margaret Kennedy is here for an colonoscopy to be performed for a history of adenomatous polyps on 2017   Risks, benefits, limitations, and alternatives regarding  colonoscopy have been reviewed with the patient.  Questions have been answered.  All parties agreeable.   Midge Minium, MD  02/22/2023, 8:38 AM

## 2023-02-22 NOTE — Op Note (Signed)
[facility] Patient Name: Margaret Kennedy Procedure Date: 02/22/2023 8:45 AM MRN: [id number] Account #: [id number] Date of Birth: 1956/02/20 Admit Type: Outpatient Age: 67 Room: [facility] OR ROOM 01 Gender: Female Note Status: Finalized Instrument Name: [id number] Procedure:             Colonoscopy Indications:           High risk colon cancer surveillance: Personal history                         of colonic polyps Providers:             Midge Minium MD, MD Referring MD:          Duanne Limerick, MD (Referring MD) Medicines:             Propofol per Anesthesia Complications:         No immediate complications. Procedure:             Pre-Anesthesia Assessment:                        - Prior to the procedure, a History and Physical was                         performed, and patient medications and allergies were                         reviewed. The patient's tolerance of previous                         anesthesia was also reviewed. The risks and benefits                         of the procedure and the sedation options and risks                         were discussed with the patient. All questions were                         answered, and informed consent was obtained. Prior                         Anticoagulants: The patient has taken no anticoagulant                         or antiplatelet agents. ASA Grade Assessment: II - A                         patient with mild systemic disease. After reviewing                         the risks and benefits, the patient was deemed in                         satisfactory condition to undergo the procedure.                        After obtaining informed consent, the colonoscope was  passed under direct vision. Throughout the procedure,                         the patient's blood pressure, pulse, and oxygen                         saturations were monitored continuously. The                          Colonoscope was introduced through the anus and                         advanced to the the cecum, identified by appendiceal                         orifice and ileocecal valve. The colonoscopy was                         performed without difficulty. The patient tolerated                         the procedure well. The quality of the bowel                         preparation was excellent. Findings:      The perianal and digital rectal examinations were normal.      A 3 mm polyp was found in the cecum. The polyp was sessile. The polyp       was removed with a cold biopsy forceps. Resection and retrieval were       complete.      Non-bleeding internal hemorrhoids were found during retroflexion. The       hemorrhoids were Grade I (internal hemorrhoids that do not prolapse). Impression:            - One 3 mm polyp in the cecum, removed with a cold                         biopsy forceps. Resected and retrieved.                        - Non-bleeding internal hemorrhoids. Recommendation:        - Discharge patient to home.                        - Resume previous diet.                        - Continue present medications.                        - Await pathology results.                        - Repeat colonoscopy in 7 years for surveillance. Procedure Code(s):     --- Professional ---                        [id number], Colonoscopy, flexible; with biopsy, single or  multiple Diagnosis Code(s):     --- Professional ---                        Z86.010, Personal history of colonic polyps                        D12.0, Benign neoplasm of cecum CPT copyright 2022 American Medical Association. All rights reserved. The codes documented in this report are preliminary and upon coder review may  be revised to meet current compliance requirements. Midge Minium MD, MD 02/22/2023 9:06:36 AM This report has been signed electronically. Number of Addenda: 0 Note Initiated On: 02/22/2023  8:45 AM Scope Withdrawal Time: 0 hours 7 minutes 23 seconds  Total Procedure Duration: 0 hours 14 minutes 30 seconds  Estimated Blood Loss:  Estimated blood loss: none.      [employer]

## 2023-02-23 ENCOUNTER — Other Ambulatory Visit: Payer: Self-pay

## 2023-02-23 ENCOUNTER — Encounter: Payer: Self-pay | Admitting: Family Medicine

## 2023-02-23 DIAGNOSIS — E782 Mixed hyperlipidemia: Secondary | ICD-10-CM

## 2023-02-23 MED ORDER — ROSUVASTATIN CALCIUM 5 MG PO TABS
5.0000 mg | ORAL_TABLET | Freq: Every day | ORAL | 0 refills | Status: DC
Start: 2023-02-23 — End: 2023-03-05

## 2023-02-24 ENCOUNTER — Encounter: Payer: Self-pay | Admitting: Gastroenterology

## 2023-03-02 ENCOUNTER — Ambulatory Visit: Payer: Medicare PPO | Admitting: Family Medicine

## 2023-03-05 ENCOUNTER — Encounter: Payer: Self-pay | Admitting: Family Medicine

## 2023-03-05 ENCOUNTER — Other Ambulatory Visit
Admission: RE | Admit: 2023-03-05 | Discharge: 2023-03-05 | Disposition: A | Discharge status: Home / Self Care | Payer: Medicare PPO | Attending: Family Medicine | Admitting: Family Medicine

## 2023-03-05 ENCOUNTER — Ambulatory Visit: Payer: Medicare PPO | Admitting: Family Medicine

## 2023-03-05 VITALS — BP 128/78 | HR 104 | Ht 67.0 in | Wt 129.0 lb

## 2023-03-05 DIAGNOSIS — E785 Hyperlipidemia, unspecified: Secondary | ICD-10-CM | POA: Insufficient documentation

## 2023-03-05 DIAGNOSIS — I4891 Unspecified atrial fibrillation: Secondary | ICD-10-CM | POA: Insufficient documentation

## 2023-03-05 DIAGNOSIS — Z7901 Long term (current) use of anticoagulants: Secondary | ICD-10-CM | POA: Diagnosis not present

## 2023-03-05 DIAGNOSIS — D649 Anemia, unspecified: Secondary | ICD-10-CM | POA: Insufficient documentation

## 2023-03-05 DIAGNOSIS — E782 Mixed hyperlipidemia: Secondary | ICD-10-CM

## 2023-03-05 DIAGNOSIS — R002 Palpitations: Secondary | ICD-10-CM

## 2023-03-05 LAB — LIPID PANEL
Cholesterol: 170 mg/dL (ref 0–200)
HDL: 57 mg/dL (ref >40)
LDL Cholesterol: 94 mg/dL (ref 0–99)
Total CHOL/HDL Ratio: 3 RATIO
Triglycerides: 96 mg/dL (ref <150)
VLDL: 19 mg/dL (ref 0–40)

## 2023-03-05 LAB — CBC WITH DIFFERENTIAL/PLATELET
Abs Immature Granulocytes: 0.02 10*3/uL (ref 0.00–0.07)
Basophils Absolute: 0.1 10*3/uL (ref 0.0–0.1)
Basophils Relative: 1 %
Eosinophils Absolute: 0.3 10*3/uL (ref 0.0–0.5)
Eosinophils Relative: 4 %
HCT: 40 % (ref 36.0–46.0)
Hemoglobin: 13.1 g/dL (ref 12.0–15.0)
Immature Granulocytes: 0 %
Lymphocytes Relative: 30 %
Lymphs Abs: 3 10*3/uL (ref 0.7–4.0)
MCH: 28.9 pg (ref 26.0–34.0)
MCHC: 32.8 g/dL (ref 30.0–36.0)
MCV: 88.1 fL (ref 80.0–100.0)
Monocytes Absolute: 0.7 10*3/uL (ref 0.1–1.0)
Monocytes Relative: 7 %
Neutro Abs: 5.6 10*3/uL (ref 1.7–7.7)
Neutrophils Relative %: 58 %
Platelets: 231 10*3/uL (ref 150–400)
RBC: 4.54 MIL/uL (ref 3.87–5.11)
RDW: 12.3 % (ref 11.5–15.5)
WBC: 9.7 10*3/uL (ref 4.0–10.5)
nRBC: 0 % (ref 0.0–0.2)

## 2023-03-05 MED ORDER — ROSUVASTATIN CALCIUM 5 MG PO TABS
5.0000 mg | ORAL_TABLET | Freq: Every day | ORAL | 1 refills | Status: DC
Start: 2023-03-05 — End: 2023-09-29

## 2023-03-05 NOTE — Progress Notes (Signed)
Date:  03/05/2023   Name:  Margaret Kennedy   DOB:  20-Apr-1956   MRN:  [id number]   Chief Complaint: Hyperlipidemia  Hyperlipidemia This is a chronic problem. The current episode started more than 1 year ago. The problem is controlled. Recent lipid tests were reviewed and are normal. She has no history of chronic renal disease, diabetes, hypothyroidism, liver disease, obesity or nephrotic syndrome. There are no known factors aggravating her hyperlipidemia. Pertinent negatives include no chest pain, leg pain, myalgias or shortness of breath. Current antihyperlipidemic treatment includes statins. The current treatment provides moderate improvement of lipids. There are no compliance problems.  Risk factors for coronary artery disease include dyslipidemia.    Lab Results  Component Value Date   NA 139 05/24/2022   K 4.9 05/24/2022   CO2 21 05/24/2022   GLUCOSE 99 05/24/2022   BUN 14 05/24/2022   CREATININE 0.76 05/24/2022   CALCIUM 9.5 05/24/2022   EGFR 87 05/24/2022   GFRNONAA 93 05/10/2020   Lab Results  Component Value Date   CHOL 241 (H) 05/24/2022   HDL 56 05/24/2022   LDLCALC 171 (H) 05/24/2022   TRIG 79 05/24/2022   CHOLHDL 3.1 05/08/2019   Lab Results  Component Value Date   TSH 1.580 05/10/2020   No results found for: "HGBA1C" Lab Results  Component Value Date   WBC 7.5 05/24/2022   HGB 12.2 05/24/2022   HCT 37.4 05/24/2022   MCV 87 05/24/2022   PLT 212 05/24/2022   Lab Results  Component Value Date   ALT 16 05/24/2022   AST 17 05/24/2022   ALKPHOS 87 05/24/2022   BILITOT 0.7 05/24/2022   No results found for: "25OHVITD2", "25OHVITD3", "VD25OH"   Review of Systems  Constitutional:  Positive for fatigue. Negative for diaphoresis, fever and unexpected weight change.  HENT:  Negative for trouble swallowing.   Eyes:  Negative for visual disturbance.  Respiratory:  Negative for chest tightness, shortness of breath and wheezing.   Cardiovascular:  Positive for  palpitations. Negative for chest pain and leg swelling.  Gastrointestinal:  Negative for abdominal distention, abdominal pain, blood in stool, constipation and diarrhea.  Genitourinary:  Negative for difficulty urinating, hematuria, vaginal bleeding and vaginal pain.  Musculoskeletal:  Negative for myalgias.  Psychiatric/Behavioral:  Positive for sleep disturbance.     Patient Active Problem List   Diagnosis Date Noted   Personal history of colonic polyps 02/22/2023   Polyp of colon 02/22/2023   Paroxysmal A-fib (HCC) 09/29/2021   TIA (transient ischemic attack) 09/29/2021   Familial hypercholesterolemia 05/23/2021    No Known Allergies  Past Surgical History:  Procedure Laterality Date   BREAST BIOPSY Left 12/03/12   u/s bx/clip-neg   COLONOSCOPY  2014   cleared for 5 years   COLONOSCOPY WITH PROPOFOL N/A 02/22/2023   Procedure: COLONOSCOPY WITH PROPOFOL;  Surgeon: Midge Minium, MD;  Location: [facility];  Service: Endoscopy;  Laterality: N/A;   POLYPECTOMY  02/22/2023   Procedure: POLYPECTOMY;  Surgeon: Midge Minium, MD;  Location: [facility];  Service: Endoscopy;;   VAGINAL HYSTERECTOMY      Social History   Tobacco Use   Smoking status: Never   Smokeless tobacco: Never  Vaping Use   Vaping status: Never Used  Substance Use Topics   Alcohol use: Yes    Alcohol/week: 7.0 standard drinks of alcohol    Types: 7 Standard drinks or equivalent per week    Comment: weekend   Drug  use: No     Medication list has been reviewed and updated.  Current Meds  Medication Sig   apixaban (ELIQUIS) 5 MG TABS tablet Take 1 tablet by mouth 2 (two) times daily.   clotrimazole-betamethasone (LOTRISONE) cream APPLY  CREAM TOPICALLY TWICE DAILY   meloxicam (MOBIC) 7.5 MG tablet Take 1 tablet (7.5 mg total) by mouth daily.   rosuvastatin (CRESTOR) 5 MG tablet Take 1 tablet (5 mg total) by mouth daily.   valACYclovir (VALTREX) 500 MG tablet Take 1 tablet (500 mg total)  by mouth 2 (two) times daily. As needed       03/05/2023    2:45 PM 06/12/2022    8:28 AM 05/24/2022    8:46 AM 05/23/2021    8:43 AM  GAD 7 : Generalized Anxiety Score  Nervous, Anxious, on Edge 0 0 0 0  Control/stop worrying 0 0 0 0  Worry too much - different things 0 0 0 0  Trouble relaxing 0 0 0 0  Restless 0 0 0 0  Easily annoyed or irritable 0 0 0 0  Afraid - awful might happen 0 0 0 0  Total GAD 7 Score 0 0 0 0  Anxiety Difficulty Not difficult at all Not difficult at all Not difficult at all Not difficult at all       03/05/2023    2:45 PM 06/21/2022   10:45 AM 06/12/2022    8:27 AM  Depression screen PHQ 2/9  Decreased Interest 0 0 0  Down, Depressed, Hopeless 0 0 0  PHQ - 2 Score 0 0 0  Altered sleeping 0  0  Tired, decreased energy 0  0  Change in appetite 0  0  Feeling bad or failure about yourself  0  0  Trouble concentrating 0  0  Moving slowly or fidgety/restless 0  0  Suicidal thoughts 0  0  PHQ-9 Score 0  0  Difficult doing work/chores Not difficult at all  Not difficult at all    BP Readings from Last 3 Encounters:  03/05/23 128/78  02/22/23 120/65  06/12/22 (!) 110/52    Physical Exam Vitals and nursing note reviewed.  Constitutional:      Appearance: She is well-developed.  HENT:     Head: Normocephalic.     Right Ear: Tympanic membrane and external ear normal. There is no impacted cerumen.     Left Ear: Tympanic membrane and external ear normal. There is no impacted cerumen.     Nose: Nose normal.     Mouth/Throat:     Mouth: Mucous membranes are moist.  Eyes:     General: Lids are everted, no foreign bodies appreciated. No scleral icterus.       Left eye: No foreign body or hordeolum.     Conjunctiva/sclera: Conjunctivae normal.     Right eye: Right conjunctiva is not injected.     Left eye: Left conjunctiva is not injected.     Pupils: Pupils are equal, round, and reactive to light.  Neck:     Thyroid: No thyromegaly.      Vascular: No JVD.     Trachea: No tracheal deviation.  Cardiovascular:     Rate and Rhythm: Normal rate and regular rhythm.     Heart sounds: Normal heart sounds. No murmur heard.    No friction rub. No gallop.  Pulmonary:     Effort: Pulmonary effort is normal. No respiratory distress.     Breath sounds: Normal breath  sounds. No wheezing, rhonchi or rales.  Abdominal:     General: Bowel sounds are normal.     Palpations: Abdomen is soft. There is no mass.     Tenderness: There is no abdominal tenderness. There is no guarding or rebound.  Musculoskeletal:        General: No tenderness. Normal range of motion.     Cervical back: Normal range of motion and neck supple.  Lymphadenopathy:     Cervical: No cervical adenopathy.  Skin:    General: Skin is warm.     Findings: No rash.  Neurological:     Mental Status: She is alert and oriented to person, place, and time.     Cranial Nerves: No cranial nerve deficit.     Deep Tendon Reflexes: Reflexes normal.  Psychiatric:        Mood and Affect: Mood is not anxious or depressed.     Wt Readings from Last 3 Encounters:  03/05/23 129 lb (58.5 kg)  02/22/23 130 lb (59 kg)  06/12/22 129 lb (58.5 kg)    BP 128/78   Pulse (!) 104   Ht 5\' 7"  (1.702 m)   Wt 129 lb (58.5 kg)   SpO2 98%   BMI 20.20 kg/m   Assessment and Plan:  1. Mixed hyperlipidemia Chronic.  Controlled.  Stable.  Currently is taking rosuvastatin 5 mg once a day.  Will check lipid panel for level of LDL control and CMP for hepatic concerns. - rosuvastatin (CRESTOR) 5 MG tablet; Take 1 tablet (5 mg total) by mouth daily.  Dispense: 90 tablet; Refill: 1 - Lipid Panel With LDL/HDL Ratio - Comprehensive Metabolic Panel (CMET)  2. On anticoagulant therapy Chronic.  Controlled.  Stable.  Patient has been fatigued which may be secondary to lack of sleep however patient does have a pale complexion and since she is on Eliquis we will check CBC to make sure there is no  insidious anemia. - CBC with Differential/Platelet  3. Palpitations Recurrent.  Episodic.  Patient showed me her Apple Watch and her rate is between 30-170.  Patient is currently not on metoprolol and I am wondering if she is having some breakthrough rapid ventricular response but the bradycardic if it is real is also concerning.  I have told her to keep a diary of the ranges of pulse rate and make note of when she is symptomatically aware of the palpitations.  We have called cardiology concerning this potential breakthrough and whether or not she needs to be considered for other means of controlling rate or consider having further evaluation and approach to   Elizabeth Sauer, MD

## 2023-04-01 ENCOUNTER — Encounter: Payer: Self-pay | Admitting: Pharmacist

## 2023-04-01 NOTE — Progress Notes (Signed)
Pharmacy Quality Measure Review  This patient is appearing on a report for being at risk of failing the adherence measure for cholesterol (statin) medications this calendar year.   Medication: rosuvastatin 5 mg daily Last fill date: 03/26/23 for 90 day supply  Insurance report was not up to date. No action needed at this time.   Adam Phenix, PharmD PGY-1 Pharmacy Resident

## 2023-04-27 DIAGNOSIS — D485 Neoplasm of uncertain behavior of skin: Secondary | ICD-10-CM | POA: Diagnosis not present

## 2023-04-27 DIAGNOSIS — L570 Actinic keratosis: Secondary | ICD-10-CM | POA: Diagnosis not present

## 2023-04-27 DIAGNOSIS — L821 Other seborrheic keratosis: Secondary | ICD-10-CM | POA: Diagnosis not present

## 2023-04-27 DIAGNOSIS — L814 Other melanin hyperpigmentation: Secondary | ICD-10-CM | POA: Diagnosis not present

## 2023-05-15 ENCOUNTER — Other Ambulatory Visit: Payer: Self-pay | Admitting: Family Medicine

## 2023-05-15 DIAGNOSIS — L570 Actinic keratosis: Secondary | ICD-10-CM | POA: Diagnosis not present

## 2023-05-15 DIAGNOSIS — Z8619 Personal history of other infectious and parasitic diseases: Secondary | ICD-10-CM

## 2023-06-15 ENCOUNTER — Telehealth: Payer: Self-pay | Admitting: Family Medicine

## 2023-06-15 NOTE — Telephone Encounter (Signed)
Copied from CRM [id number]. Topic: Medicare AWV >> Jun 15, 2023  2:10 PM Margaret Kennedy wrote: Reason for CRM: Called LVM 06/15/2023 to schedule Annual Wellness Visit  Verlee Rossetti; Care Guide Ambulatory Clinical Support [place] [facility] Direct Dial: [phone]

## 2023-07-25 ENCOUNTER — Other Ambulatory Visit: Payer: Self-pay | Admitting: Family Medicine

## 2023-07-25 DIAGNOSIS — Z8619 Personal history of other infectious and parasitic diseases: Secondary | ICD-10-CM

## 2023-07-30 ENCOUNTER — Encounter: Payer: Self-pay | Admitting: Family Medicine

## 2023-07-30 NOTE — Telephone Encounter (Signed)
Please review.  KP

## 2023-08-27 DIAGNOSIS — G459 Transient cerebral ischemic attack, unspecified: Secondary | ICD-10-CM | POA: Diagnosis not present

## 2023-08-27 DIAGNOSIS — I480 Paroxysmal atrial fibrillation: Secondary | ICD-10-CM | POA: Diagnosis not present

## 2023-08-27 DIAGNOSIS — Z23 Encounter for immunization: Secondary | ICD-10-CM | POA: Diagnosis not present

## 2023-09-03 DIAGNOSIS — H9041 Sensorineural hearing loss, unilateral, right ear, with unrestricted hearing on the contralateral side: Secondary | ICD-10-CM | POA: Diagnosis not present

## 2023-09-03 DIAGNOSIS — M26621 Arthralgia of right temporomandibular joint: Secondary | ICD-10-CM | POA: Diagnosis not present

## 2023-09-03 DIAGNOSIS — H9201 Otalgia, right ear: Secondary | ICD-10-CM | POA: Diagnosis not present

## 2023-09-24 ENCOUNTER — Other Ambulatory Visit: Payer: Self-pay | Admitting: Family Medicine

## 2023-09-24 DIAGNOSIS — Z1231 Encounter for screening mammogram for malignant neoplasm of breast: Secondary | ICD-10-CM

## 2023-09-29 ENCOUNTER — Other Ambulatory Visit: Payer: Self-pay | Admitting: Family Medicine

## 2023-09-29 DIAGNOSIS — E782 Mixed hyperlipidemia: Secondary | ICD-10-CM

## 2023-10-11 ENCOUNTER — Encounter: Payer: Self-pay | Admitting: Family Medicine

## 2023-10-11 ENCOUNTER — Ambulatory Visit (INDEPENDENT_AMBULATORY_CARE_PROVIDER_SITE_OTHER): Payer: Medicare PPO | Admitting: Family Medicine

## 2023-10-11 ENCOUNTER — Ambulatory Visit
Admission: RE | Admit: 2023-10-11 | Discharge: 2023-10-11 | Disposition: A | Discharge status: Home / Self Care | Payer: Medicare PPO | Source: Ambulatory Visit | Attending: Family Medicine | Admitting: Family Medicine

## 2023-10-11 ENCOUNTER — Telehealth: Payer: Self-pay | Admitting: Family Medicine

## 2023-10-11 VITALS — BP 120/72 | HR 60 | Ht 67.0 in | Wt 132.4 lb

## 2023-10-11 DIAGNOSIS — E782 Mixed hyperlipidemia: Secondary | ICD-10-CM

## 2023-10-11 DIAGNOSIS — Z1231 Encounter for screening mammogram for malignant neoplasm of breast: Secondary | ICD-10-CM | POA: Diagnosis not present

## 2023-10-11 MED ORDER — ROSUVASTATIN CALCIUM 5 MG PO TABS
5.0000 mg | ORAL_TABLET | Freq: Every day | ORAL | 3 refills | Status: AC
Start: 2023-10-11 — End: ?

## 2023-10-11 NOTE — Telephone Encounter (Signed)
 Patient came in requesting a lab put in for Revu

## 2023-10-11 NOTE — Progress Notes (Signed)
 Date:  10/11/2023   Name:  Margaret Kennedy   DOB:  05/30/1956   MRN:  [id number]   Chief Complaint: Medical Management of Chronic Issues (hyperlipidemia)  Hyperlipidemia This is a chronic problem. The current episode started more than 1 year ago. The problem is controlled. Recent lipid tests were reviewed and are normal. She has no history of chronic renal disease, diabetes, hypothyroidism, liver disease, obesity or nephrotic syndrome. There are no known factors aggravating her hyperlipidemia. Pertinent negatives include no chest pain, focal sensory loss, focal weakness, leg pain, myalgias or shortness of breath. Current antihyperlipidemic treatment includes statins. The current treatment provides moderate improvement of lipids. There are no compliance problems.     Lab Results  Component Value Date   NA 139 05/24/2022   K 4.9 05/24/2022   CO2 21 05/24/2022   GLUCOSE 99 05/24/2022   BUN 14 05/24/2022   CREATININE 0.76 05/24/2022   CALCIUM 9.5 05/24/2022   EGFR 87 05/24/2022   GFRNONAA 93 05/10/2020   Lab Results  Component Value Date   CHOL 170 03/05/2023   HDL 57 03/05/2023   LDLCALC 94 03/05/2023   TRIG 96 03/05/2023   CHOLHDL 3.0 03/05/2023   Lab Results  Component Value Date   TSH 1.580 05/10/2020   No results found for: "HGBA1C" Lab Results  Component Value Date   WBC 9.7 03/05/2023   HGB 13.1 03/05/2023   HCT 40.0 03/05/2023   MCV 88.1 03/05/2023   PLT 231 03/05/2023   Lab Results  Component Value Date   ALT 16 05/24/2022   AST 17 05/24/2022   ALKPHOS 87 05/24/2022   BILITOT 0.7 05/24/2022   No results found for: "25OHVITD2", "25OHVITD3", "VD25OH"   Review of Systems  Constitutional:  Negative for unexpected weight change.  HENT:  Negative for trouble swallowing.   Eyes:  Negative for visual disturbance.  Respiratory:  Negative for apnea, cough, choking, chest tightness, shortness of breath, wheezing and stridor.   Cardiovascular:  Negative for chest  pain, palpitations and leg swelling.  Gastrointestinal:  Negative for abdominal distention, abdominal pain, anal bleeding, blood in stool, constipation and diarrhea.  Endocrine: Negative for polydipsia and polyuria.  Genitourinary:  Negative for difficulty urinating, dyspareunia, dysuria, hematuria and vaginal bleeding.  Musculoskeletal:  Negative for myalgias.  Neurological:  Negative for focal weakness.    Patient Active Problem List   Diagnosis Date Noted   History of colonic polyps 02/22/2023   Polyp of colon 02/22/2023   Paroxysmal A-fib (HCC) 09/29/2021   TIA (transient ischemic attack) 09/29/2021   Familial hypercholesterolemia 05/23/2021    No Known Allergies  Past Surgical History:  Procedure Laterality Date   BREAST BIOPSY Left 12/03/12   u/s bx/clip-neg   COLONOSCOPY  2014   cleared for 5 years   COLONOSCOPY WITH PROPOFOL N/A 02/22/2023   Procedure: COLONOSCOPY WITH PROPOFOL;  Surgeon: Midge Minium, MD;  Location: [facility];  Service: Endoscopy;  Laterality: N/A;   POLYPECTOMY  02/22/2023   Procedure: POLYPECTOMY;  Surgeon: Midge Minium, MD;  Location: [facility];  Service: Endoscopy;;   VAGINAL HYSTERECTOMY      Social History   Tobacco Use   Smoking status: Never   Smokeless tobacco: Never  Vaping Use   Vaping status: Never Used  Substance Use Topics   Alcohol use: Yes    Alcohol/week: 7.0 standard drinks of alcohol    Types: 7 Standard drinks or equivalent per week    Comment: weekend  Drug use: No     Medication list has been reviewed and updated.  Current Meds  Medication Sig   apixaban (ELIQUIS) 5 MG TABS tablet Take 1 tablet by mouth 2 (two) times daily.   meloxicam (MOBIC) 7.5 MG tablet Take 1 tablet (7.5 mg total) by mouth daily.   rosuvastatin (CRESTOR) 5 MG tablet Take 1 tablet by mouth once daily   valACYclovir (VALTREX) 500 MG tablet Take 1 tablet by mouth twice daily as needed       10/11/2023    1:22 PM 03/05/2023     2:45 PM 06/12/2022    8:28 AM 05/24/2022    8:46 AM  GAD 7 : Generalized Anxiety Score  Nervous, Anxious, on Edge 0 0 0 0  Control/stop worrying 0 0 0 0  Worry too much - different things  0 0 0  Trouble relaxing  0 0 0  Restless  0 0 0  Easily annoyed or irritable  0 0 0  Afraid - awful might happen  0 0 0  Total GAD 7 Score  0 0 0  Anxiety Difficulty  Not difficult at all Not difficult at all Not difficult at all       10/11/2023    1:22 PM 03/05/2023    2:45 PM 06/21/2022   10:45 AM  Depression screen PHQ 2/9  Decreased Interest 0 0 0  Down, Depressed, Hopeless 0 0 0  PHQ - 2 Score 0 0 0  Altered sleeping  0   Tired, decreased energy  0   Change in appetite  0   Feeling bad or failure about yourself   0   Trouble concentrating  0   Moving slowly or fidgety/restless  0   Suicidal thoughts  0   PHQ-9 Score  0   Difficult doing work/chores  Not difficult at all     BP Readings from Last 3 Encounters:  10/11/23 120/72  03/05/23 128/78  02/22/23 120/65    Physical Exam Vitals and nursing note reviewed.  Constitutional:      General: She is not in acute distress.    Appearance: She is not diaphoretic.  HENT:     Head: Normocephalic and atraumatic.     Right Ear: External ear normal.     Left Ear: External ear normal.     Nose: Nose normal.  Eyes:     General:        Right eye: No discharge.        Left eye: No discharge.     Conjunctiva/sclera: Conjunctivae normal.     Pupils: Pupils are equal, round, and reactive to light.  Neck:     Thyroid: No thyromegaly.     Vascular: No JVD.  Cardiovascular:     Rate and Rhythm: Normal rate and regular rhythm.     Heart sounds: Normal heart sounds. No murmur heard.    No friction rub. No gallop.  Pulmonary:     Effort: Pulmonary effort is normal.     Breath sounds: Normal breath sounds. No wheezing, rhonchi or rales.  Abdominal:     General: Bowel sounds are normal.     Palpations: Abdomen is soft. There is no  hepatomegaly, splenomegaly or mass.     Tenderness: There is no abdominal tenderness. There is no guarding.  Musculoskeletal:        General: Normal range of motion.     Cervical back: Normal range of motion and neck supple.  Lymphadenopathy:  Cervical: No cervical adenopathy.  Skin:    General: Skin is warm.  Neurological:     Mental Status: She is alert.     Deep Tendon Reflexes:     Reflex Scores:      Achilles reflexes are 3+ on the right side and 3+ on the left side.    Wt Readings from Last 3 Encounters:  10/11/23 132 lb 6 oz (60 kg)  03/05/23 129 lb (58.5 kg)  02/22/23 130 lb (59 kg)    BP 120/72   Pulse 60   Ht 5\' 7"  (1.702 m)   Wt 132 lb 6 oz (60 kg)   SpO2 98%   BMI 20.73 kg/m   Assessment and Plan: 1. Mixed hyperlipidemia (Primary) Chronic.  Controlled.  Stable.  Asymptomatic.  Without myalgias or muscle weakness.  Tolerating rosuvastatin 5 mg very well.  Will likely continue pending CMP for hepatic concerns and lipid panel for current level of control.  Patient has been given low-cholesterol low triglyceride dietary guidelines for follow-up.  We will recheck patient in 1 year. - Comprehensive metabolic panel - Lipid Panel With LDL/HDL Ratio - rosuvastatin (CRESTOR) 5 MG tablet; Take 1 tablet (5 mg total) by mouth daily.  Dispense: 90 tablet; Refill: 3     Elizabeth Sauer, MD

## 2023-10-11 NOTE — Patient Instructions (Signed)

## 2023-10-12 ENCOUNTER — Encounter: Payer: Self-pay | Admitting: Family Medicine

## 2023-10-12 LAB — COMPREHENSIVE METABOLIC PANEL
ALT: 19 IU/L (ref 0–32)
AST: 22 IU/L (ref 0–40)
Albumin: 4.9 g/dL (ref 3.9–4.9)
Alkaline Phosphatase: 93 IU/L (ref 44–121)
BUN/Creatinine Ratio: 21 (ref 12–28)
BUN: 14 mg/dL (ref 8–27)
Bilirubin Total: 0.8 mg/dL (ref 0.0–1.2)
CO2: 23 mmol/L (ref 20–29)
Calcium: 9.4 mg/dL (ref 8.7–10.3)
Chloride: 102 mmol/L (ref 96–106)
Creatinine, Ser: 0.67 mg/dL (ref 0.57–1.00)
Globulin, Total: 2.3 g/dL (ref 1.5–4.5)
Glucose: 90 mg/dL (ref 70–99)
Potassium: 4.4 mmol/L (ref 3.5–5.2)
Sodium: 142 mmol/L (ref 134–144)
Total Protein: 7.2 g/dL (ref 6.0–8.5)
eGFR: 96 mL/min/{1.73_m2} (ref >59)

## 2023-10-12 LAB — LIPID PANEL WITH LDL/HDL RATIO
Cholesterol, Total: 150 mg/dL (ref 100–199)
HDL: 53 mg/dL (ref >39)
LDL Chol Calc (NIH): 80 mg/dL (ref 0–99)
LDL/HDL Ratio: 1.5 ratio (ref 0.0–3.2)
Triglycerides: 88 mg/dL (ref 0–149)
VLDL Cholesterol Cal: 17 mg/dL (ref 5–40)

## 2023-12-25 ENCOUNTER — Ambulatory Visit
Admission: RE | Admit: 2023-12-25 | Discharge: 2023-12-25 | Disposition: A | Discharge status: Home / Self Care | Source: Ambulatory Visit | Attending: Physician Assistant | Admitting: Physician Assistant

## 2023-12-25 ENCOUNTER — Other Ambulatory Visit: Payer: Self-pay | Admitting: Physician Assistant

## 2023-12-25 DIAGNOSIS — S0990XA Unspecified injury of head, initial encounter: Secondary | ICD-10-CM | POA: Diagnosis present

## 2024-01-09 ENCOUNTER — Encounter: Payer: Self-pay | Admitting: Licensed Clinical Social Worker

## 2024-01-09 ENCOUNTER — Inpatient Hospital Stay

## 2024-01-09 ENCOUNTER — Inpatient Hospital Stay: Attending: Oncology | Admitting: Licensed Clinical Social Worker

## 2024-01-09 DIAGNOSIS — Z803 Family history of malignant neoplasm of breast: Secondary | ICD-10-CM | POA: Diagnosis not present

## 2024-01-09 DIAGNOSIS — Z1379 Encounter for other screening for genetic and chromosomal anomalies: Secondary | ICD-10-CM

## 2024-01-09 DIAGNOSIS — Z1501 Genetic susceptibility to malignant neoplasm of breast: Secondary | ICD-10-CM

## 2024-01-09 DIAGNOSIS — Z8042 Family history of malignant neoplasm of prostate: Secondary | ICD-10-CM

## 2024-01-09 DIAGNOSIS — Z1509 Genetic susceptibility to other malignant neoplasm: Secondary | ICD-10-CM

## 2024-01-09 NOTE — Progress Notes (Signed)
 REFERRING PROVIDER: Melchor Spoon, MD [address] [facility] [place],  [place] [address]  PRIMARY PROVIDER:  Melchor Spoon, MD  PRIMARY REASON FOR VISIT:  1. ATM gene mutation positive   2. Family history of breast cancer   3. Family history of prostate cancer      HISTORY OF PRESENT ILLNESS:   Margaret Kennedy, a 68 y.o. female, was seen for a [place] cancer genetics consultation at the request of Dr. 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 (p.[id number]).  Ms. Hoes presents to clinic today to discuss the possibility of a hereditary predisposition to cancer, genetic testing, and to further clarify her future cancer risks, as well as potential cancer risks for family members.   CANCER HISTORY:  Margaret Kennedy is a 68 y.o. female with no personal history of cancer aside from basal cell carcinoma at 38.   RISK FACTORS:  Menarche was at age 47.  First live birth at age 35.  Ovaries intact: yes.  Hysterectomy: yes.  Menopausal status: postmenopausal.  HRT use: 0 years. Colonoscopy: yes; has had 2-5 polyps. Mammogram within the last year: yes.  Past Medical History:  Diagnosis Date   Arthritis    Atrial fibrillation (HCC)    Cancer (HCC)    skin   History of cold sores    TIA (transient ischemic attack)    over 20 yrs ago.  no deficits   Wears hearing aid in right ear     Past Surgical History:  Procedure Laterality Date   BREAST BIOPSY Left 12/03/12   u/s bx/clip-neg   COLONOSCOPY  2014   cleared for 5 years   COLONOSCOPY WITH PROPOFOL  N/A 02/22/2023   Procedure: COLONOSCOPY WITH PROPOFOL ;  Surgeon: Marnee Sink, MD;  Location: [facility];  Service: Endoscopy;  Laterality: N/A;   POLYPECTOMY  02/22/2023   Procedure: POLYPECTOMY;  Surgeon: Marnee Sink, MD;  Location: [facility];  Service: Endoscopy;;   VAGINAL HYSTERECTOMY      FAMILY HISTORY:  We obtained a detailed,  4-generation family history.  Significant diagnoses are listed below: Family History  Problem Relation Age of Onset   Lung cancer Mother 3   Liver cancer Mother 3   Lung cancer Father 71   Skin cancer Sister    Melanoma Sister 57   Prostate cancer Brother 94   Skin cancer Brother    Prostate cancer Brother 50   Skin cancer Brother    Prostate cancer Paternal Uncle 11   Breast cancer Daughter 78       ATM+   Prostate cancer Cousin        3 paternal cousins w/ prostate   Margaret Kennedy has 2 daughters. One had breast cancer at 50 and is living at 1, she had genetic testing that showed an ATM mutation. Margaret Kennedy other daughter also has the ATM mutation. Margaret Kennedy has 1 twin sister, another sister, and 2 brothers. Her twin sister has had melanoma. Her other sister has had skin cancer. Her brothers have both had prostate cancer and skin cancers, unsure if they have had genetic testing.  Margaret Kennedy mother had lung cancer at 54 and liver at 67 years old and has passed. No other known cancers on the maternal side of the family.  Margaret Kennedy father had lung cancer at 37, prostate cancer and melanoma. He passed at 29. Patient's paternal uncle had prostate cancer as well and  all 3 of his sons had prostate cancer diagnosed in their 40s/50s. One of these paternal cousins did have genetic testing that showed an MUTYH mutation but not an ATM mutation.  Margaret Kennedy is unaware of previous family history of genetic testing for hereditary cancer risks. There is no reported Ashkenazi Jewish ancestry. There is no known consanguinity.     GENETIC TESTING: Margaret Kennedy had genetic testing through her PCP, Dr. Rodolfo Clan. 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 Specifically, this variant is [id number].  The test report has been scanned into EPIC and is located under the  Molecular Pathology section of the Results Review tab.  A portion of the result report is included below for reference. Genetic testing reported out on 11/13/2023.    Cancer Risks for ATM: Women have a 20-40% lifetime risk of breast cancer. 2-3% risk for epithelial ovarian cancer 5-10% risk for pancreatic cancer  There is emerging evidence suggesting an increased risk for prostate cancer.  Research is continuing to help learn more about the cancers associated with ATM pathogenic variants and what the exact risks are to develop these cancers.   Management Recommendations:  Breast Screening/Risk Reduction: Breast cancer screening includes: Breast awareness beginning at age 41 Monthly self-breast examination beginning at age 21 Clinical breast examination every 6-12 months beginning at age 66 or at the age of the earliest diagnosed breast cancer in the family, if onset was before age 42 Annual mammogram with consideration of tomosynthesis starting at age 11 or 10 years prior to the youngest age of diagnosis, whichever comes first Consider breast MRI with and without contrast starting at age 71-35 Consider additional risk reducing strategies such as Tamoxifen Evidence is insufficient for a prophylactic risk-reducing mastectomy, manage based on family history  For patients who are treated for breast cancer and have not had bilateral mastectomy, screening should continue as described  Ovarian Cancer Screening/Risk Reduction: Evidence insufficient for risk-reducing salpingo oophorectomy; manage based on family history If there is a family history of ovarian cancer, have a discussion with your physician about the benefits and limitations of screening and risk reducing strategies  Pancreatic Cancer Screening/Risk Reduction: Consider pancreatic cancer screening beginning at age 62 (or 10 years younger than the earliest exocrine pancreatic cancer diagnosis in the family, whichever is earlier) For  individuals considering pancreatic cancer screening, recommend this be performed in experienced high-volume centers, and recommend that screening take place after in-depth discussion about potential limitations to screening such as cost, high incidence of benign/intermediate pancreatic abnormalities and uncertainties about potential benefits of pancreatic cancer screening Consider screening with annual contrast-enhanced MRI/MRCP and/or endoscopic EUS with consideration of shorter screening intervals based on clinical judgement for individuals found to have potentially concerning abnormalities on screening  Prostate Cancer Screening: Consider beginning annual PSA blood test and digital rectal exams at age 20.  Additional considerations: There is insufficient evidence to recommend against radiation therapy.  Individuals with a single pathogenic ATM variant are also carriers of ataxia telangiectasia. Ataxia telangiectasia is associated with childhood cancer risks as well as other medical problems (such as difficulty with movement, balance and coordination problems, neuropathy, and weakened immunity). For there to be a risk of ataxia telangiectasia in offspring, both the patient and their partner would each have to carry a pathogenic variant in ATM; in this case, the risk to have an affected child is 25%.  This information is based on current understanding of the gene and may change in the future.  Implications for Family Members: Hereditary predisposition to cancer due to pathogenic variants in the ATM gene has autosomal dominant inheritance. This means that an individual with a pathogenic variant has a 50% chance of passing the condition on to his/her offspring. Identification of a pathogenic variant allows for the recognition of at-risk relatives who can pursue testing for the familial variant.   Family members are encouraged to consider genetic testing for this familial pathogenic variant. As there  are generally no childhood cancer risks associated with pathogenic variants in the ATM gene, individuals in the family are not recommended to have testing until they reach at least 67 years of age. They may contact our office at [phone] for more information or to schedule an appointment. Family members who live outside of the area are encouraged to find a genetic counselor in their area by visiting: [email or web address].  Resources: FORCE (Facing Our Risk of Cancer Empowered) is a resource for those with a hereditary predisposition to develop cancer.  FORCE provides information about risk reduction, advocacy, legislation, and clinical trials.  Additionally, FORCE provides a platform for collaboration and support; which includes: peer navigation, message boards, local support groups, a toll-free helpline, research registry and recruitment, advocate training, published medical research, webinars, brochures, mastectomy photos, and more.  For more information, visit www.facingourrisk.org Ms. Heskett's questions were answered to her satisfaction today. Our contact information was provided should additional questions or concerns arise. Thank you for the referral and allowing us  to share in the care of your patient.   PLAN: She would like a referral to the high risk clinic [facility] to follow her long-term for this indication and coordinate screening/prophylactic surgeries. Refer placed today. She would like the oncologist to refer her to [facility] (Dr. Michele Ahle) for consideration of pancreatic cancer screening.   2. Ms. Anstine plans to discuss these results with her family and will reach out to us  if we can be of any assistance in coordinating genetic testing for any of her relatives.   We encouraged Ms. Puccini to remain in contact with us  on an annual basis so we can update her personal and family histories, and let her know of advances in cancer genetics  that may benefit the family. Our contact number was provided. Ms. Geng questions were answered to her satisfaction today, and she knows she is welcome to call anytime with additional questions.   Valri Gee, MS, [facility] Genetic Counselor [email or web address] Phone: [phone]  50 minutes were spent on the date of the encounter in service to the patient including preparation, face-to-face consultation, documentation and care coordination. Dr. Nelson Bandy was available for discussion regarding this case.   _______________________________________________________________________ For Office Staff:  Number of people involved in session: 2; Patient's twin sister Libby Ree was also present.  Was an Intern/ student involved with case: no

## 2024-01-17 ENCOUNTER — Encounter: Payer: Self-pay | Admitting: Oncology

## 2024-01-17 ENCOUNTER — Inpatient Hospital Stay: Attending: Oncology | Admitting: Oncology

## 2024-01-17 ENCOUNTER — Inpatient Hospital Stay

## 2024-01-17 VITALS — BP 131/54 | HR 60 | Temp 98.9°F | Resp 19 | Wt 132.5 lb

## 2024-01-17 DIAGNOSIS — Z79899 Other long term (current) drug therapy: Secondary | ICD-10-CM | POA: Insufficient documentation

## 2024-01-17 DIAGNOSIS — Z1502 Genetic susceptibility to malignant neoplasm of ovary: Secondary | ICD-10-CM | POA: Diagnosis not present

## 2024-01-17 DIAGNOSIS — Z803 Family history of malignant neoplasm of breast: Secondary | ICD-10-CM

## 2024-01-17 DIAGNOSIS — Z1509 Genetic susceptibility to other malignant neoplasm: Secondary | ICD-10-CM | POA: Insufficient documentation

## 2024-01-17 DIAGNOSIS — I4891 Unspecified atrial fibrillation: Secondary | ICD-10-CM | POA: Insufficient documentation

## 2024-01-17 DIAGNOSIS — Z1501 Genetic susceptibility to malignant neoplasm of breast: Secondary | ICD-10-CM | POA: Diagnosis not present

## 2024-01-17 DIAGNOSIS — Z7901 Long term (current) use of anticoagulants: Secondary | ICD-10-CM | POA: Diagnosis not present

## 2024-01-17 NOTE — Progress Notes (Signed)
 [facility]  [phone] [phone]  ID: BILLIJO DILLING OB: 08-20-1955  MR#: [id number]  UVO#:[id number]  Patient Care Team: Melchor Spoon, MD as PCP - General (Internal Medicine)  CHIEF COMPLAINT: ATM gene mutation positive.  INTERVAL HISTORY: Patient is a 68 year old female with no personal history of malignancy.  Underwent genetic testing after her daughter diagnosed with breast cancer.  Was found to have ATM gene mutation and was referred for further evaluation and discussion of possible preventive measures.  She currently feels well and is asymptomatic.  She has no neurologic complaints.  She denies any recent fevers or illnesses.  She has good appetite and denies weight loss.  She has no chest pain, shortness of breath, cough, or hemoptysis.  She denies any nausea, vomiting, constipation, or diarrhea.  She has no urinary complaints.  Patient offers no specific complaints today.  REVIEW OF SYSTEMS:   Review of Systems  Constitutional: Negative.  Negative for fever, malaise/fatigue and weight loss.  Respiratory: Negative.  Negative for cough, hemoptysis and shortness of breath.   Cardiovascular: Negative.  Negative for chest pain and leg swelling.  Gastrointestinal: Negative.  Negative for abdominal pain.  Genitourinary: Negative.  Negative for dysuria.  Musculoskeletal: Negative.  Negative for back pain.  Skin: Negative.  Negative for rash.  Neurological: Negative.  Negative for dizziness, focal weakness, weakness and headaches.  Psychiatric/Behavioral: Negative.  The patient is not nervous/anxious.     As per HPI. Otherwise, a complete review of systems is negative.  PAST MEDICAL HISTORY: Past Medical History:  Diagnosis Date   Arthritis    Atrial fibrillation (HCC)    Cancer (HCC)    skin   History of cold sores    TIA (transient ischemic attack)    over 20 yrs ago.  no deficits   Wears hearing aid in right ear     PAST  SURGICAL HISTORY: Past Surgical History:  Procedure Laterality Date   BREAST BIOPSY Left 12/03/12   u/s bx/clip-neg   COLONOSCOPY  2014   cleared for 5 years   COLONOSCOPY WITH PROPOFOL  N/A 02/22/2023   Procedure: COLONOSCOPY WITH PROPOFOL ;  Surgeon: Marnee Sink, MD;  Location: [facility];  Service: Endoscopy;  Laterality: N/A;   POLYPECTOMY  02/22/2023   Procedure: POLYPECTOMY;  Surgeon: Marnee Sink, MD;  Location: [facility];  Service: Endoscopy;;   VAGINAL HYSTERECTOMY      FAMILY HISTORY: Family History  Problem Relation Age of Onset   Lung cancer Mother 84   Liver cancer Mother 8   Lung cancer Father 32   Skin cancer Sister    Melanoma Sister 36   Prostate cancer Brother 6   Skin cancer Brother    Prostate cancer Brother 51   Skin cancer Brother    Prostate cancer Paternal Uncle 88   Breast cancer Daughter 41       ATM+   Prostate cancer Cousin        3 paternal cousins w/ prostate    ADVANCED DIRECTIVES (Y/N):  N  HEALTH MAINTENANCE: Social History   Tobacco Use   Smoking status: Never   Smokeless tobacco: Never  Vaping Use   Vaping status: Never Used  Substance Use Topics   Alcohol use: Yes    Alcohol/week: 7.0 standard drinks of alcohol    Types: 7 Standard drinks or equivalent per week    Comment: weekend   Drug use: No     Colonoscopy:  PAP:  Bone density:  Lipid panel:  No Known Allergies  Current Outpatient Medications  Medication Sig Dispense Refill   apixaban (ELIQUIS) 5 MG TABS tablet Take 1 tablet by mouth 2 (two) times daily.     meloxicam  (MOBIC ) 7.5 MG tablet Take 1 tablet (7.5 mg total) by mouth daily. 30 tablet 11   rosuvastatin  (CRESTOR ) 5 MG tablet Take 1 tablet (5 mg total) by mouth daily. 90 tablet 3   valACYclovir  (VALTREX ) 500 MG tablet Take 1 tablet by mouth twice daily as needed 30 tablet 0   clotrimazole -betamethasone  (LOTRISONE ) cream APPLY  CREAM TOPICALLY TWICE DAILY (Patient not taking: Reported on  01/17/2024) 30 g 0   No current facility-administered medications for this visit.    OBJECTIVE: Vitals:   01/17/24 1330  BP: (!) 131/54  Pulse: 60  Resp: 19  Temp: 98.9 F (37.2 C)  SpO2: 99%     Body mass index is 20.75 kg/m.    ECOG FS:0 - Asymptomatic  General: Well-developed, well-nourished, no acute distress. Eyes: Pink conjunctiva, anicteric sclera. HEENT: Normocephalic, moist mucous membranes. Lungs: No audible wheezing or coughing. Heart: Regular rate and rhythm. Abdomen: Soft, nontender, no obvious distention. Musculoskeletal: No edema, cyanosis, or clubbing. Neuro: Alert, answering all questions appropriately. Cranial nerves grossly intact. Skin: No rashes or petechiae noted. Psych: Normal affect. Lymphatics: No cervical, calvicular, axillary or inguinal LAD.   LAB RESULTS:  Lab Results  Component Value Date   NA 142 10/11/2023   K 4.4 10/11/2023   CL 102 10/11/2023   CO2 23 10/11/2023   GLUCOSE 90 10/11/2023   BUN 14 10/11/2023   CREATININE 0.67 10/11/2023   CALCIUM  9.4 10/11/2023   PROT 7.2 10/11/2023   ALBUMIN 4.9 10/11/2023   AST 22 10/11/2023   ALT 19 10/11/2023   ALKPHOS 93 10/11/2023   BILITOT 0.8 10/11/2023   GFRNONAA 93 05/10/2020   GFRAA 107 05/10/2020    Lab Results  Component Value Date   WBC 9.7 03/05/2023   NEUTROABS 5.6 03/05/2023   HGB 13.1 03/05/2023   HCT 40.0 03/05/2023   MCV 88.1 03/05/2023   PLT 231 03/05/2023     STUDIES: CT HEAD WO CONTRAST ( ) Result Date: 12/25/2023 CLINICAL DATA:  68 year old female on Eliquis, lingering pain at area of head trauma several weeks earlier. EXAM: CT HEAD WITHOUT CONTRAST TECHNIQUE: Contiguous axial images were obtained from the base of the skull through the vertex without intravenous contrast. RADIATION DOSE REDUCTION: This exam was performed according to the departmental dose-optimization program which includes automated exposure control, adjustment of the mA and/or kV according to  patient size and/or use of iterative reconstruction technique. COMPARISON:  Brain MRI 11/02/2015.  Head CT 09/19/2007. FINDINGS: Brain: Cerebral volume remains normal for age. No midline shift, ventriculomegaly, mass effect, evidence of mass lesion, intracranial hemorrhage or evidence of cortically based acute infarction. Gray-white differentiation is symmetric, within normal limits for age. Vascular: No suspicious intracranial vascular hyperdensity. Calcified atherosclerosis at the skull base. Skull: Scaphocephaly, normal variant. Intact. No fracture or acute osseous abnormality identified. Sinuses/Orbits: Visualized paranasal sinuses and mastoids are stable and well aerated. Other: No discrete orbit or scalp soft tissue injury identified. IMPRESSION: Normal for age noncontrast Head CT. No recent traumatic injury identified. Electronically Signed   By: Marlise Simpers M.D.   On: 12/25/2023 10:44    ASSESSMENT: ATM gene mutation positive.  PLAN:    ATM gene mutation positive: Appreciate genetic counseling input.  Patient has a nearly 40% lifetime risk of  breast cancer and we discussed the possibility of tamoxifen prophylaxis for 5 years which patient declined.  We also discussed the possibility of alternating breast MRI with mammogram for monitoring.  She has no interest in prophylactic mastectomy.  Her risk of pancreatic cancer is approximately 5 to 10% and we discussed possible monitoring with yearly pancreatic MRI and CA 19-9, patient is also not interested in these at this time, but will call clinic if she changes her mind.  There is also mild increased risk of ovarian cancer and patient has had a hysterectomy, but is unclear if this was a total hysterectomy.  No further follow-up is needed.  Patient will call clinic if she decides to proceed with any of the interventions above.  I spent a total of 60 minutes reviewing chart data, face-to-face evaluation with the patient, counseling and coordination of care as  detailed above.   Patient expressed understanding and was in agreement with this plan. She also understands that She can call clinic at any time with any questions, concerns, or complaints.    Shellie Dials, MD   01/17/2024 4:27 PM

## 2024-07-18 ENCOUNTER — Ambulatory Visit
Admission: EM | Admit: 2024-07-18 | Discharge: 2024-07-18 | Disposition: A | Discharge status: Home / Self Care | Attending: Nurse Practitioner | Admitting: Nurse Practitioner

## 2024-07-18 DIAGNOSIS — J040 Acute laryngitis: Secondary | ICD-10-CM | POA: Diagnosis not present

## 2024-07-18 DIAGNOSIS — J029 Acute pharyngitis, unspecified: Secondary | ICD-10-CM | POA: Diagnosis not present

## 2024-07-18 LAB — POCT RAPID STREP A (OFFICE): Rapid Strep A Screen: NEGATIVE

## 2024-07-18 NOTE — ED Triage Notes (Signed)
 Pt present sore throat along with hurts to swallow. Symptoms started a two days ago.

## 2024-07-18 NOTE — Discharge Instructions (Addendum)
 Strep test is negative. Most likely you have a viral illness: no antibiotic is indicated at this time, May treat with OTC meds of choice(tylenol ,chloraseptic throat lozenges as label directed). Gargle with warm salt water  several times daily,hot tea/honey may help sooth throat as well. Make sure to drink plenty of fluids to stay hydrated(gatorade, water , popsicles,jello,etc).  Follow up with PCP.

## 2024-07-18 NOTE — ED Provider Notes (Signed)
 [facility]    CSN: [id number] Arrival date & time: 07/18/24  0827      History   Chief Complaint Chief Complaint  Patient presents with   Sore Throat    HPI Margaret Kennedy is a 68 y.o. female.   Margaret Kennedy, 68 year old female, presents to urgent care for evaluation of sore throat hurts to swallow x 2 days.  Patient has tried warm salt water  gargles as well as hot tea with minimal  improvement of results.  No known illness exposure  The history is provided by the patient. No language interpreter was used.    Past Medical History:  Diagnosis Date   Arthritis    Atrial fibrillation (HCC)    Cancer (HCC)    skin   History of cold sores    TIA (transient ischemic attack)    over 20 yrs ago.  no deficits   Wears hearing aid in right ear     Patient Active Problem List   Diagnosis Date Noted   Viral pharyngitis 07/18/2024   Laryngitis 07/18/2024   History of colonic polyps 02/22/2023   Polyp of colon 02/22/2023   Paroxysmal A-fib (HCC) 09/29/2021   TIA (transient ischemic attack) 09/29/2021   Familial hypercholesterolemia 05/23/2021    Past Surgical History:  Procedure Laterality Date   BREAST BIOPSY Left 12/03/12   u/s bx/clip-neg   COLONOSCOPY  2014   cleared for 5 years   COLONOSCOPY WITH PROPOFOL  N/A 02/22/2023   Procedure: COLONOSCOPY WITH PROPOFOL ;  Surgeon: Jinny Carmine, MD;  Location: [facility];  Service: Endoscopy;  Laterality: N/A;   POLYPECTOMY  02/22/2023   Procedure: POLYPECTOMY;  Surgeon: Jinny Carmine, MD;  Location: [facility];  Service: Endoscopy;;   VAGINAL HYSTERECTOMY      OB History   No obstetric history on file.      Home Medications    Prior to Admission medications   Medication Sig Start Date End Date Taking? Authorizing Provider  apixaban (ELIQUIS) 5 MG TABS tablet Take 1 tablet by mouth 2 (two) times daily. 09/29/21   [provider]  clotrimazole -betamethasone  (LOTRISONE ) cream APPLY   CREAM TOPICALLY TWICE DAILY Patient not taking: Reported on 01/17/2024 12/07/22   Joshua Cathryne BROCKS, MD  meloxicam  (MOBIC ) 7.5 MG tablet Take 1 tablet (7.5 mg total) by mouth daily. 05/23/21   Joshua Cathryne BROCKS, MD  rosuvastatin  (CRESTOR ) 5 MG tablet Take 1 tablet (5 mg total) by mouth daily. 10/11/23   Joshua Cathryne BROCKS, MD  valACYclovir  (VALTREX ) 500 MG tablet Take 1 tablet by mouth twice daily as needed 07/25/23   Joshua Cathryne BROCKS, MD    Family History Family History  Problem Relation Age of Onset   Lung cancer Mother 24   Liver cancer Mother 49   Lung cancer Father 74   Skin cancer Sister    Melanoma Sister 85   Prostate cancer Brother 86   Skin cancer Brother    Prostate cancer Brother 12   Skin cancer Brother    Prostate cancer Paternal Uncle 69   Breast cancer Daughter 31       ATM+   Prostate cancer Cousin        3 paternal cousins w/ prostate    Social History Social History   Tobacco Use   Smoking status: Never   Smokeless tobacco: Never  Vaping Use   Vaping status: Never Used  Substance Use Topics   Alcohol use: Yes    Alcohol/week: 7.0  standard drinks of alcohol    Types: 7 Standard drinks or equivalent per week    Comment: weekend   Drug use: No     Allergies   Patient has no known allergies.   Review of Systems Review of Systems  Constitutional:  Negative for activity change, appetite change and fever.  HENT:  Positive for sore throat and voice change.   Respiratory:  Negative for cough.   All other systems reviewed and are negative.    Physical Exam Triage Vital Signs ED Triage Vitals  Encounter Vitals Group     BP 07/18/24 0841 (!) 105/53     Girls Systolic BP Percentile --      Girls Diastolic BP Percentile --      Boys Systolic BP Percentile --      Boys Diastolic BP Percentile --      Pulse Rate 07/18/24 0841 79     Resp 07/18/24 0841 16     Temp 07/18/24 0841 98.5 F (36.9 C)     Temp Source 07/18/24 0841 Oral     SpO2 07/18/24 0841 100 %      Weight 07/18/24 0840 132 lb (59.9 kg)     Height --      Head Circumference --      Peak Flow --      Pain Score 07/18/24 0840 7     Pain Loc --      Pain Education --      Exclude from Growth Chart --    No data found.  Updated Vital Signs BP (!) 105/53 (BP Location: Left Arm)   Pulse 79   Temp 98.5 F (36.9 C) (Oral)   Resp 16   Wt 132 lb (59.9 kg)   SpO2 100%   BMI 20.67 kg/m   Visual Acuity Right Eye Distance:   Left Eye Distance:   Bilateral Distance:    Right Eye Near:   Left Eye Near:    Bilateral Near:     Physical Exam Vitals and nursing note reviewed.  HENT:     Head: Normocephalic.     Right Ear: Tympanic membrane normal.     Left Ear: Tympanic membrane normal.     Nose: Nose normal.     Mouth/Throat:     Lips: Pink.     Mouth: Mucous membranes are moist.     Pharynx: Uvula midline. Posterior oropharyngeal erythema and postnasal drip present.     Tonsils: No tonsillar exudate or tonsillar abscesses.     Comments: Pt has 2 lesions on upper lip Cardiovascular:     Rate and Rhythm: Normal rate and regular rhythm.     Heart sounds: Normal heart sounds.  Pulmonary:     Effort: Pulmonary effort is normal.     Breath sounds: Normal breath sounds and air entry.  Neurological:     General: No focal deficit present.     Mental Status: She is alert and oriented to person, place, and time.     GCS: GCS eye subscore is 4. GCS verbal subscore is 5. GCS motor subscore is 6.  Psychiatric:        Attention and Perception: Attention normal.        Mood and Affect: Mood normal.        Speech: Speech normal.        Behavior: Behavior normal. Behavior is cooperative.      UC Treatments / Results  Labs (all labs ordered are listed,  but only abnormal results are displayed) Labs Reviewed  POCT RAPID STREP A (OFFICE) - Normal    EKG   Radiology No results found.  Procedures Procedures (including critical care time)  Medications Ordered in  UC Medications - No data to display  Initial Impression / Assessment and Plan / UC Course  I have reviewed the triage vital signs and the nursing notes.  Pertinent labs & imaging results that were available during my care of the patient were reviewed by me and considered in my medical decision making (see chart for details).    Discussed exam findings and plan of care with patient, strep tst negative, most likely viral given laryngitis and mouth lesions(hx of), takes valtrex , push fluids, otc meds, strict go to ER precautions given.   Patient verbalized understanding to this provider.  Ddx: Viral pharyngitis, laryngitis, allergies Final Clinical Impressions(s) / UC Diagnoses   Final diagnoses:  Viral pharyngitis  Laryngitis     Discharge Instructions      Strep test is negative. Most likely you have a viral illness: no antibiotic is indicated at this time, May treat with OTC meds of choice(tylenol ,chloraseptic throat lozenges as label directed). Gargle with warm salt water  several times daily,hot tea/honey may help sooth throat as well. Make sure to drink plenty of fluids to stay hydrated(gatorade, water , popsicles,jello,etc).  Follow up with PCP.      ED Prescriptions   None    PDMP not reviewed this encounter.   Aminta Loose, NP 07/18/24 [phone]
